# Patient Record
Sex: Female | Born: 1960 | Race: White | Hispanic: No | Marital: Married | Smoking: Never smoker
Health system: Southern US, Community
[De-identification: ages and names within clinical notes are randomized; demographics above are authoritative.]

## PROBLEM LIST (undated history)

## (undated) DIAGNOSIS — Z9189 Other specified personal risk factors, not elsewhere classified: Secondary | ICD-10-CM

## (undated) DIAGNOSIS — Z803 Family history of malignant neoplasm of breast: Secondary | ICD-10-CM

## (undated) DIAGNOSIS — E7800 Pure hypercholesterolemia, unspecified: Secondary | ICD-10-CM

## (undated) DIAGNOSIS — B019 Varicella without complication: Secondary | ICD-10-CM

## (undated) DIAGNOSIS — Z1371 Encounter for nonprocreative screening for genetic disease carrier status: Secondary | ICD-10-CM

## (undated) HISTORY — PX: COLONOSCOPY: SHX174

## (undated) HISTORY — DX: Encounter for nonprocreative screening for genetic disease carrier status: Z13.71

## (undated) HISTORY — DX: Other specified personal risk factors, not elsewhere classified: Z91.89

## (undated) HISTORY — DX: Pure hypercholesterolemia, unspecified: E78.00

## (undated) HISTORY — DX: Varicella without complication: B01.9

## (undated) HISTORY — DX: Family history of malignant neoplasm of breast: Z80.3

---

## 2004-11-23 ENCOUNTER — Ambulatory Visit: Payer: Self-pay | Admitting: Internal Medicine

## 2004-12-06 ENCOUNTER — Ambulatory Visit: Payer: Self-pay | Admitting: Internal Medicine

## 2005-07-02 ENCOUNTER — Ambulatory Visit: Payer: Self-pay | Admitting: Internal Medicine

## 2005-11-22 ENCOUNTER — Ambulatory Visit: Payer: Self-pay | Admitting: Internal Medicine

## 2006-11-28 ENCOUNTER — Ambulatory Visit: Payer: Self-pay | Admitting: Unknown Physician Specialty

## 2007-12-02 ENCOUNTER — Ambulatory Visit: Payer: Self-pay | Admitting: Unknown Physician Specialty

## 2008-12-07 ENCOUNTER — Ambulatory Visit: Payer: Self-pay | Admitting: Unknown Physician Specialty

## 2009-12-21 ENCOUNTER — Ambulatory Visit: Payer: Self-pay | Admitting: Unknown Physician Specialty

## 2011-01-10 ENCOUNTER — Ambulatory Visit: Payer: Self-pay | Admitting: Unknown Physician Specialty

## 2012-01-15 ENCOUNTER — Ambulatory Visit: Payer: Self-pay | Admitting: Family Medicine

## 2012-01-21 ENCOUNTER — Ambulatory Visit: Payer: Self-pay | Admitting: Family Medicine

## 2013-01-20 ENCOUNTER — Ambulatory Visit: Payer: Self-pay | Admitting: Family Medicine

## 2013-03-15 HISTORY — PX: COLONOSCOPY: SHX174

## 2014-01-25 ENCOUNTER — Ambulatory Visit: Payer: Self-pay | Admitting: Nurse Practitioner

## 2014-12-16 DIAGNOSIS — Z1371 Encounter for nonprocreative screening for genetic disease carrier status: Secondary | ICD-10-CM

## 2014-12-16 DIAGNOSIS — Z9189 Other specified personal risk factors, not elsewhere classified: Secondary | ICD-10-CM

## 2014-12-16 HISTORY — DX: Encounter for nonprocreative screening for genetic disease carrier status: Z13.71

## 2014-12-16 HISTORY — DX: Other specified personal risk factors, not elsewhere classified: Z91.89

## 2015-01-20 ENCOUNTER — Other Ambulatory Visit: Payer: Self-pay | Admitting: Obstetrics and Gynecology

## 2015-01-20 DIAGNOSIS — Z1231 Encounter for screening mammogram for malignant neoplasm of breast: Secondary | ICD-10-CM

## 2015-02-01 ENCOUNTER — Ambulatory Visit
Admission: RE | Admit: 2015-02-01 | Discharge: 2015-02-01 | Disposition: A | Discharge status: Home / Self Care | Payer: BLUE CROSS/BLUE SHIELD | Source: Ambulatory Visit | Attending: Obstetrics and Gynecology | Admitting: Obstetrics and Gynecology

## 2015-02-01 DIAGNOSIS — Z1231 Encounter for screening mammogram for malignant neoplasm of breast: Secondary | ICD-10-CM | POA: Diagnosis present

## 2015-02-01 LAB — HM MAMMOGRAPHY

## 2015-09-21 ENCOUNTER — Encounter: Payer: Self-pay | Admitting: Family Medicine

## 2015-09-21 ENCOUNTER — Ambulatory Visit (INDEPENDENT_AMBULATORY_CARE_PROVIDER_SITE_OTHER): Payer: BLUE CROSS/BLUE SHIELD | Admitting: Family Medicine

## 2015-09-21 ENCOUNTER — Encounter (INDEPENDENT_AMBULATORY_CARE_PROVIDER_SITE_OTHER): Payer: Self-pay

## 2015-09-21 VITALS — BP 130/75 | HR 99 | Temp 97.8°F | Ht 66.5 in | Wt 113.1 lb

## 2015-09-21 DIAGNOSIS — M549 Dorsalgia, unspecified: Secondary | ICD-10-CM | POA: Insufficient documentation

## 2015-09-21 DIAGNOSIS — F419 Anxiety disorder, unspecified: Secondary | ICD-10-CM | POA: Diagnosis not present

## 2015-09-21 DIAGNOSIS — M546 Pain in thoracic spine: Secondary | ICD-10-CM

## 2015-09-21 DIAGNOSIS — G500 Trigeminal neuralgia: Secondary | ICD-10-CM | POA: Insufficient documentation

## 2015-09-21 MED ORDER — CYCLOBENZAPRINE HCL 10 MG PO TABS: 10.0000 mg | ORAL_TABLET | Freq: Three times a day (TID) | ORAL | 0 refills | Status: DC | PRN

## 2015-09-21 MED ORDER — CITALOPRAM HYDROBROMIDE 20 MG PO TABS: 20.0000 mg | ORAL_TABLET | Freq: Every day | ORAL | 1 refills | Status: DC

## 2015-09-21 MED ORDER — ALPRAZOLAM 0.5 MG PO TABS: 0.5000 mg | ORAL_TABLET | Freq: Three times a day (TID) | ORAL | 0 refills | Status: DC | PRN

## 2015-09-21 NOTE — Assessment & Plan Note (Signed)
New problem. Appears to be MSK in origin. Advised use of NSAIDs as needed and Rx for Flexeril given to be used as needed. No indication for imaging.  Does not appear to be causing/contributing to patient's complaint of R ankle/foot numbness.

## 2015-09-21 NOTE — Progress Notes (Signed)
Pre visit review using our clinic review tool, if applicable. No additional management support is needed unless otherwise documented below in the visit note. 

## 2015-09-21 NOTE — Progress Notes (Signed)
Subjective:  Patient ID: Dana Booth, female    DOB: [birth year]  Age: 55 y.o. MRN: [id number]  CC: Anxiety, back pain  HPI Dana Booth is a 55 y.o. female presents to the clinic today as a new patient with the above complaints.  Anxiety  Patient endorses issues with anxiety earlier in life.  Over the past 1.5 years she's had increasing anxiety.  She states she has times where she feels like she is having a panic attack.  Anxiety has been worse after developing symptoms of trigeminal neuralgia and being concerned about MS. She is currently being followed by neurologist - Dr. Sherryll BurgerShah. They have discussed MRI but this is also a source of anxiety for her as she is claustrophobic.   Patient is incredibly anxious about undergoing an MRI and is quite concerned that she may have MS.  Patient is currently taking BuSpar. This was recently started.  Patient would like to discuss treatment options regarding her anxiety and she feels like this is severe.  Back pain  Patient reports a 3 to 4 month history of left-sided thoracic back pain.  Patient describes the pain as sharp.   Worse with certain movements.  Has been worse after walking and training her dog.  She reports that she has numbness in her right foot and lateral ankle. She is concerned that this is coming from her back as well.  She states that the numbness is worse when she's standing and improves when she bends forward.  Patient feels like her back pain is been exacerbated by certain activities and walking and training her dog.  No known relieving factors.  No other associated symptoms.  PMH, Surgical Hx, Family Hx, Social History reviewed and updated as below.  Past Medical History:  Diagnosis Date  . Chicken pox    Past Surgical History:  Procedure Laterality Date  . NO PAST SURGERIES     Family History  Problem Relation Age of Onset  . Breast cancer Mother 2460  . Arthritis Mother   . Breast cancer  Maternal Aunt 60  . Breast cancer Paternal Grandmother 7670  . Arthritis Father   . Diabetes Father   . Hyperlipidemia Father   . Hypertension Father    Social History  Substance Use Topics  . Smoking status: Never Smoker  . Smokeless tobacco: Never Used  . Alcohol use No   Review of Systems  Musculoskeletal: Positive for back pain.       R foot, ankle numbness/tingling.  Psychiatric/Behavioral: The patient is nervous/anxious.        Anxiety, stress.   All other systems reviewed and are negative.  Objective:   Today's Vitals: BP 130/75 (BP Location: Right Arm, Patient Position: Sitting, Cuff Size: Normal)   Pulse 99   Temp 97.8 F (36.6 C) (Oral)   Ht 5' 6.5" (1.689 m)   Wt 113 lb 2 oz (51.3 kg)   SpO2 97%   BMI 17.99 kg/m   Physical Exam  Constitutional: She is oriented to person, place, and time. She appears well-developed. No distress.  HENT:  Head: Normocephalic and atraumatic.  Mouth/Throat: Oropharynx is clear and moist.  Eyes: Conjunctivae are normal. No scleral icterus.  Neck: Neck supple.  Cardiovascular: Regular rhythm.  Tachycardia present.   Pulmonary/Chest: Effort normal. She has no wheezes. She has no rales.  Abdominal: Soft. She exhibits no distension. There is no tenderness. There is no rebound and no guarding.  Musculoskeletal:  Back - normal range  of motion. Left lower thoracic spine nontender to palpation.  Lymphadenopathy:    She has no cervical adenopathy.  Neurological: She is alert and oriented to person, place, and time.  No apparent focal neurological deficits.  Skin: Skin is warm and dry. No rash noted.  Psychiatric:  Anxious; tearful at times.  Vitals reviewed.  Assessment & Plan:   Problem List Items Addressed This Visit    Anxiety - Primary    New problem. This is the patient's primary issue. Anxiety seems to be debilitating and likely contributing to patient's other symptoms. I do not feel that the patient has underlying  neurological issues to suggest MS.  Sending to psychology for counseling/therapy. Starting on Celexa and PRN Xanax. Stop Buspar.       Relevant Medications   citalopram (CELEXA) 20 MG tablet   ALPRAZolam (XANAX) 0.5 MG tablet   Other Relevant Orders   Ambulatory referral to Psychology   Back pain    New problem. Appears to be MSK in origin. Advised use of NSAIDs as needed and Rx for Flexeril given to be used as needed. No indication for imaging.  Does not appear to be causing/contributing to patient's complaint of R ankle/foot numbness.      Relevant Medications   cyclobenzaprine (FLEXERIL) 10 MG tablet    Other Visit Diagnoses   None.     Outpatient Encounter Prescriptions as of 09/21/2015  Medication Sig  . Cholecalciferol (VITAMIN D3) 2000 units capsule Take by mouth.  . gabapentin (NEURONTIN) 300 MG capsule Take by mouth 3 (three) times daily.  . norethindrone (CAMILA) 0.35 MG tablet Take by mouth.  . [DISCONTINUED] busPIRone (BUSPAR) 10 MG tablet Take by mouth.  . ALPRAZolam (XANAX) 0.5 MG tablet Take 1 tablet (0.5 mg total) by mouth 3 (three) times daily as needed for anxiety.  . citalopram (CELEXA) 20 MG tablet Take 1 tablet (20 mg total) by mouth daily.  . cyclobenzaprine (FLEXERIL) 10 MG tablet Take 1 tablet (10 mg total) by mouth 3 (three) times daily as needed for muscle spasms.   No facility-administered encounter medications on file as of 09/21/2015.     Follow-up: Return in about 6 weeks (around 11/02/2015).  Everlene Other DO [facility]

## 2015-09-21 NOTE — Patient Instructions (Signed)
Take the medication as prescribed.  Use ibuprofen or aleve as needed for your back. Use the muscle relaxer if needed.  Follow up in 6 weeks.  Take care  Dr. Adriana Simasook

## 2015-09-21 NOTE — Assessment & Plan Note (Signed)
New problem. This is the patient's primary issue. Anxiety seems to be debilitating and likely contributing to patient's other symptoms. I do not feel that the patient has underlying neurological issues to suggest MS.  Sending to psychology for counseling/therapy. Starting on Celexa and PRN Xanax. Stop Buspar.

## 2015-09-23 ENCOUNTER — Telehealth: Payer: Self-pay | Admitting: Family Medicine

## 2015-09-23 NOTE — Telephone Encounter (Signed)
Pt saw Dr. Adriana Simasook this past Wednesday. She called today, she is dizzie, weak, calmy and a little faint whe she first wakes up. She is not sure if it is the citalopram (CELEXA) 20 MG tablet. Please advise.  Thanks

## 2015-09-23 NOTE — Telephone Encounter (Signed)
Urgent care ED? Please advise

## 2015-09-23 NOTE — Telephone Encounter (Signed)
Likely related to underlying anxiety. Doubt it is from the medication as it was just started. Continue to monitor.

## 2015-09-23 NOTE — Telephone Encounter (Signed)
Patient has been notified

## 2015-09-29 ENCOUNTER — Ambulatory Visit (INDEPENDENT_AMBULATORY_CARE_PROVIDER_SITE_OTHER): Payer: BLUE CROSS/BLUE SHIELD | Admitting: Family Medicine

## 2015-09-29 ENCOUNTER — Encounter: Payer: Self-pay | Admitting: Family Medicine

## 2015-09-29 DIAGNOSIS — F419 Anxiety disorder, unspecified: Secondary | ICD-10-CM | POA: Diagnosis not present

## 2015-09-29 MED ORDER — DIAZEPAM 2 MG PO TABS: 2.0000 mg | ORAL_TABLET | Freq: Two times a day (BID) | ORAL | 0 refills | Status: DC | PRN

## 2015-09-29 NOTE — Progress Notes (Signed)
Pre visit review using our clinic review tool, if applicable. No additional management support is needed unless otherwise documented below in the visit note. 

## 2015-09-29 NOTE — Progress Notes (Signed)
   Subjective:  Patient ID: Dana Booth, female    DOB: 26-Oct-1960  Age: 55 y.o. MRN: [id number]  CC: Medication issues/Anxiety  HPI:  55 year old female with severe anxiety presents with the above complaints.  Anxiety/Medication issues  Patient seen on 9/6.  Started on PRN Xanax and Celexa for anxiety.  Patient presents today with complaints of medication side effects/concerns.  Patient has been experiencing nausea, lightheadedness, fatigue. She attributes these to the Celexa. She states she is quite sedated from Xanax when she takes it. She states that she has to lie down because of the sedation.  As a result of her concern for medication side effects, she has decreased her dose of Celexa down to 10 mg daily. She is using the Xanax sparingly.   Patient states that she does feel slightly improved hours after use of Xanax. However, she still has severe anxiety.  She has not been contacted about her referral to psychology yet.  Patient would like to discuss treatment options/medication changes.  Social Hx   Social History   Social History  . Marital status: Married    Spouse name: N/A  . Number of children: N/A  . Years of education: N/A   Social History Main Topics  . Smoking status: Never Smoker  . Smokeless tobacco: Never Used  . Alcohol use No  . Drug use: No  . Sexual activity: Yes   Other Topics Concern  . None   Social History Narrative  . None   Review of Systems  Constitutional: Positive for fatigue.  Gastrointestinal: Positive for nausea.  Neurological: Positive for light-headedness.  Psychiatric/Behavioral:       Anxiety.    Objective:  BP 136/75 (BP Location: Right Arm, Patient Position: Sitting, Cuff Size: Normal)   Pulse 88   Temp 98.3 F (36.8 C) (Oral)   Wt 112 lb 8 oz (51 kg)   SpO2 99%   BMI 17.89 kg/m   BP/Weight 09/29/2015 09/21/2015  Systolic BP 136 130  Diastolic BP 75 75  Wt. (Lbs) 112.5 113.13  BMI 17.89 17.99     Physical Exam  Constitutional: She is oriented to person, place, and time. She appears well-developed. No distress.  HENT:  Head: Normocephalic and atraumatic.  Pulmonary/Chest: Effort normal.  Neurological: She is alert and oriented to person, place, and time.  Psychiatric:  Incredibly anxious and perseverative about symptoms.  Vitals reviewed.    Assessment & Plan:   Problem List Items Addressed This Visit    Anxiety    Established problem, worsening/uncontrolled. Advised decrease in Celexa to 10 mg daily. Stopping Xanax. Will try low dose Valium. Needs to see psychology ASAP.      Relevant Medications   diazepam (VALIUM) 2 MG tablet    Other Visit Diagnoses   None.     Meds ordered this encounter  Medications  . diazepam (VALIUM) 2 MG tablet    Sig: Take 1 tablet (2 mg total) by mouth every 12 (twelve) hours as needed for anxiety.    Dispense:  30 tablet    Refill:  0    Follow-up: Has follow up scheduled.   Everlene OtherJayce Gaetana Kawahara DO [facility] [place]

## 2015-09-29 NOTE — Patient Instructions (Signed)
Continue the decreased Celexa (10 mg daily).  Use the Valium 2 mg twice daily as needed (start with it in the am).  I will expedite the therapist appt.  Follow up in 4-6 weeks.  Take care  Dr. Adriana Simasook

## 2015-09-29 NOTE — Assessment & Plan Note (Signed)
Established problem, worsening/uncontrolled. Advised decrease in Celexa to 10 mg daily. Stopping Xanax. Will try low dose Valium. Needs to see psychology ASAP.

## 2015-10-06 ENCOUNTER — Ambulatory Visit (INDEPENDENT_AMBULATORY_CARE_PROVIDER_SITE_OTHER): Payer: BLUE CROSS/BLUE SHIELD | Admitting: Psychology

## 2015-10-06 DIAGNOSIS — F411 Generalized anxiety disorder: Secondary | ICD-10-CM | POA: Diagnosis not present

## 2015-10-13 ENCOUNTER — Ambulatory Visit (INDEPENDENT_AMBULATORY_CARE_PROVIDER_SITE_OTHER): Payer: BLUE CROSS/BLUE SHIELD | Admitting: Psychology

## 2015-10-13 DIAGNOSIS — F411 Generalized anxiety disorder: Secondary | ICD-10-CM | POA: Diagnosis not present

## 2015-10-20 ENCOUNTER — Ambulatory Visit (INDEPENDENT_AMBULATORY_CARE_PROVIDER_SITE_OTHER): Payer: BLUE CROSS/BLUE SHIELD | Admitting: Psychology

## 2015-10-20 DIAGNOSIS — F411 Generalized anxiety disorder: Secondary | ICD-10-CM

## 2015-10-24 ENCOUNTER — Telehealth: Payer: Self-pay | Admitting: Family Medicine

## 2015-10-24 NOTE — Telephone Encounter (Signed)
[facility] sent message stating that pt declined appt to them at this time. Referral has been closed.

## 2015-10-27 ENCOUNTER — Ambulatory Visit (INDEPENDENT_AMBULATORY_CARE_PROVIDER_SITE_OTHER): Payer: BLUE CROSS/BLUE SHIELD | Admitting: Psychology

## 2015-10-27 DIAGNOSIS — F411 Generalized anxiety disorder: Secondary | ICD-10-CM

## 2015-11-07 ENCOUNTER — Ambulatory Visit (INDEPENDENT_AMBULATORY_CARE_PROVIDER_SITE_OTHER): Payer: BLUE CROSS/BLUE SHIELD | Admitting: Family Medicine

## 2015-11-07 ENCOUNTER — Ambulatory Visit (INDEPENDENT_AMBULATORY_CARE_PROVIDER_SITE_OTHER): Payer: BLUE CROSS/BLUE SHIELD

## 2015-11-07 ENCOUNTER — Encounter: Payer: Self-pay | Admitting: Family Medicine

## 2015-11-07 ENCOUNTER — Ambulatory Visit: Payer: BLUE CROSS/BLUE SHIELD | Admitting: Family Medicine

## 2015-11-07 VITALS — BP 136/82 | HR 92 | Temp 98.0°F | Wt 114.1 lb

## 2015-11-07 DIAGNOSIS — F419 Anxiety disorder, unspecified: Secondary | ICD-10-CM

## 2015-11-07 DIAGNOSIS — M545 Low back pain: Secondary | ICD-10-CM

## 2015-11-07 DIAGNOSIS — G8929 Other chronic pain: Secondary | ICD-10-CM

## 2015-11-07 DIAGNOSIS — Z23 Encounter for immunization: Secondary | ICD-10-CM

## 2015-11-07 NOTE — Progress Notes (Signed)
Pre visit review using our clinic review tool, if applicable. No additional management support is needed unless otherwise documented below in the visit note. 

## 2015-11-07 NOTE — Patient Instructions (Signed)
I am glad you're doing well.  We will call with your xray results.  Follow up to be arranged after the results return.  Take care  Dr. Adriana Simasook

## 2015-11-07 NOTE — Progress Notes (Signed)
   Subjective:  Patient ID: Dana PennaAmy L Berenguer, female    DOB: October 16, 1960  Age: 55 y.o. MRN: [id number]  CC: Follow up Anxiety  HPI:  55 year old female with trigeminal neuralgia, anxiety presents for follow-up. She also has complaints of right foot numbness as well as right buttock pain.  Anxiety  Drastically improved with celexa and seeing psychologist.  Not taking Benzo at this time.  She states that she is doing much better.  Would like to continue celexa.  R buttock pain, R foot numbness  Patient reports that over the last few months she's had numbness in her right foot. She is told me about this previously.  Her neurologist felt like this was from Morton's neuroma.  However, she now complains of right buttock pain. This is new.  She states it is worse with standing and sitting.  She reports relief with flexion.  She is unsure if it's correlated/connected with the right foot numbness.  She does not endorse any radiating pain that comes from the back downward.  No reports of numbness or tingling or pain in the region from the low back/buttock to the foot.  She is currently on gabapentin. She has continued to have symptoms despite this.  Social Hx   Social History   Social History  . Marital status: Married    Spouse name: N/A  . Number of children: N/A  . Years of education: N/A   Social History Main Topics  . Smoking status: Never Smoker  . Smokeless tobacco: Never Used  . Alcohol use No  . Drug use: No  . Sexual activity: Yes   Other Topics Concern  . None   Social History Narrative  . None   Review of Systems  Musculoskeletal: Positive for back pain.  Neurological: Positive for numbness.  Psychiatric/Behavioral: The patient is nervous/anxious.    Objective:  BP 136/82 (BP Location: Right Arm, Patient Position: Sitting, Cuff Size: Normal)   Pulse 92   Temp 98 F (36.7 C) (Oral)   Wt 114 lb 2 oz (51.8 kg)   SpO2 95%   BMI 18.14 kg/m    BP/Weight 11/07/2015 09/29/2015 09/21/2015  Systolic BP 136 136 130  Diastolic BP 82 75 75  Wt. (Lbs) 114.13 112.5 113.13  BMI 18.14 17.89 17.99   Physical Exam  Constitutional: She is oriented to person, place, and time. She appears well-developed. No distress.  Pulmonary/Chest: Effort normal.  Musculoskeletal:  Mild tenderness to palpation at the area of the piriformis muscle. Negative straight leg raise.   Neurological: She is alert and oriented to person, place, and time.  Psychiatric: She has a normal mood and affect.  Vitals reviewed.  Assessment & Plan:   Problem List Items Addressed This Visit    Chronic right-sided low back pain    New problem. Numbness in foot may or may not be related. Obtaining Xray to assess.      Relevant Orders   DG Lumbar Spine Complete (Completed)   Anxiety - Primary    Improved. Continue Celexa and psychology visits.       Other Visit Diagnoses    Encounter for immunization       Relevant Orders   Flu Vaccine QUAD 36+ mos IM (Completed)     Follow-up: Pending xray results.   Everlene OtherJayce Yulia Ulrich DO [facility] [place]

## 2015-11-08 DIAGNOSIS — M545 Low back pain: Secondary | ICD-10-CM

## 2015-11-08 DIAGNOSIS — G8929 Other chronic pain: Secondary | ICD-10-CM | POA: Insufficient documentation

## 2015-11-08 NOTE — Assessment & Plan Note (Signed)
New problem. Numbness in foot may or may not be related. Obtaining Xray to assess.

## 2015-11-08 NOTE — Assessment & Plan Note (Signed)
Improved. Continue Celexa and psychology visits.

## 2015-11-10 ENCOUNTER — Ambulatory Visit (INDEPENDENT_AMBULATORY_CARE_PROVIDER_SITE_OTHER): Payer: BLUE CROSS/BLUE SHIELD | Admitting: Psychology

## 2015-11-10 DIAGNOSIS — F411 Generalized anxiety disorder: Secondary | ICD-10-CM | POA: Diagnosis not present

## 2015-11-17 ENCOUNTER — Ambulatory Visit (INDEPENDENT_AMBULATORY_CARE_PROVIDER_SITE_OTHER): Payer: BLUE CROSS/BLUE SHIELD | Admitting: Psychology

## 2015-11-17 DIAGNOSIS — F411 Generalized anxiety disorder: Secondary | ICD-10-CM

## 2015-11-24 ENCOUNTER — Ambulatory Visit (INDEPENDENT_AMBULATORY_CARE_PROVIDER_SITE_OTHER): Payer: BLUE CROSS/BLUE SHIELD | Admitting: Psychology

## 2015-11-24 DIAGNOSIS — F411 Generalized anxiety disorder: Secondary | ICD-10-CM | POA: Diagnosis not present

## 2015-12-01 ENCOUNTER — Ambulatory Visit (INDEPENDENT_AMBULATORY_CARE_PROVIDER_SITE_OTHER): Payer: BLUE CROSS/BLUE SHIELD | Admitting: Psychology

## 2015-12-01 DIAGNOSIS — F411 Generalized anxiety disorder: Secondary | ICD-10-CM

## 2015-12-15 ENCOUNTER — Ambulatory Visit (INDEPENDENT_AMBULATORY_CARE_PROVIDER_SITE_OTHER): Payer: BLUE CROSS/BLUE SHIELD | Admitting: Psychology

## 2015-12-15 DIAGNOSIS — F411 Generalized anxiety disorder: Secondary | ICD-10-CM

## 2015-12-23 ENCOUNTER — Other Ambulatory Visit: Payer: Self-pay | Admitting: Obstetrics and Gynecology

## 2015-12-23 DIAGNOSIS — Z1231 Encounter for screening mammogram for malignant neoplasm of breast: Secondary | ICD-10-CM

## 2015-12-29 ENCOUNTER — Ambulatory Visit (INDEPENDENT_AMBULATORY_CARE_PROVIDER_SITE_OTHER): Payer: BLUE CROSS/BLUE SHIELD | Admitting: Psychology

## 2015-12-29 DIAGNOSIS — F411 Generalized anxiety disorder: Secondary | ICD-10-CM | POA: Diagnosis not present

## 2016-01-17 LAB — HM PAP SMEAR: HM PAP: NEGATIVE

## 2016-02-02 ENCOUNTER — Ambulatory Visit: Payer: BLUE CROSS/BLUE SHIELD

## 2016-02-13 ENCOUNTER — Ambulatory Visit
Admission: RE | Admit: 2016-02-13 | Discharge: 2016-02-13 | Disposition: A | Discharge status: Home / Self Care | Payer: BLUE CROSS/BLUE SHIELD | Source: Ambulatory Visit | Attending: Obstetrics and Gynecology | Admitting: Obstetrics and Gynecology

## 2016-02-13 DIAGNOSIS — Z1231 Encounter for screening mammogram for malignant neoplasm of breast: Secondary | ICD-10-CM | POA: Diagnosis present

## 2016-03-13 ENCOUNTER — Encounter: Payer: Self-pay | Admitting: Obstetrics and Gynecology

## 2016-04-05 ENCOUNTER — Other Ambulatory Visit: Payer: Self-pay | Admitting: Family Medicine

## 2016-09-20 ENCOUNTER — Ambulatory Visit (INDEPENDENT_AMBULATORY_CARE_PROVIDER_SITE_OTHER): Payer: BLUE CROSS/BLUE SHIELD | Admitting: Family Medicine

## 2016-09-20 ENCOUNTER — Encounter: Payer: Self-pay | Admitting: Family Medicine

## 2016-09-20 VITALS — BP 116/74 | HR 94 | Temp 98.6°F | Resp 16 | Wt 125.0 lb

## 2016-09-20 DIAGNOSIS — Z13220 Encounter for screening for lipoid disorders: Secondary | ICD-10-CM

## 2016-09-20 DIAGNOSIS — F419 Anxiety disorder, unspecified: Secondary | ICD-10-CM

## 2016-09-20 DIAGNOSIS — R739 Hyperglycemia, unspecified: Secondary | ICD-10-CM

## 2016-09-20 DIAGNOSIS — G500 Trigeminal neuralgia: Secondary | ICD-10-CM | POA: Diagnosis not present

## 2016-09-20 DIAGNOSIS — Z130 Encounter for screening for diseases of the blood and blood-forming organs and certain disorders involving the immune mechanism: Secondary | ICD-10-CM

## 2016-09-20 MED ORDER — CITALOPRAM HYDROBROMIDE 20 MG PO TABS: 20.0000 mg | ORAL_TABLET | Freq: Every day | ORAL | 3 refills | Status: DC

## 2016-09-20 NOTE — Patient Instructions (Signed)
If you want to taper: Celexa 10 mg for 1-2 weeks, then stop. If you have symptoms, you can go do 10 mg every other day after.  Follow up annually.  Take care  Dr. Adriana Simasook

## 2016-09-21 LAB — CBC
HCT: 40 % (ref 36.0–46.0)
HEMOGLOBIN: 13.3 g/dL (ref 12.0–15.0)
MCHC: 33.3 g/dL (ref 30.0–36.0)
MCV: 92.6 fl (ref 78.0–100.0)
Platelets: 225 10*3/uL (ref 150.0–400.0)
RBC: 4.32 Mil/uL (ref 3.87–5.11)
RDW: 12.6 % (ref 11.5–15.5)
WBC: 7.7 10*3/uL (ref 4.0–10.5)

## 2016-09-21 LAB — LIPID PANEL
CHOL/HDL RATIO: 3
Cholesterol: 189 mg/dL (ref 0–200)
HDL: 60.3 mg/dL (ref >39.00)
LDL Cholesterol: 110 mg/dL — ABNORMAL HIGH (ref 0–99)
NONHDL: 128.79
Triglycerides: 92 mg/dL (ref 0.0–149.0)
VLDL: 18.4 mg/dL (ref 0.0–40.0)

## 2016-09-21 LAB — COMPREHENSIVE METABOLIC PANEL
ALK PHOS: 48 U/L (ref 39–117)
ALT: 16 U/L (ref 0–35)
AST: 19 U/L (ref 0–37)
Albumin: 4.7 g/dL (ref 3.5–5.2)
BILIRUBIN TOTAL: 0.8 mg/dL (ref 0.2–1.2)
BUN: 19 mg/dL (ref 6–23)
CO2: 32 meq/L (ref 19–32)
CREATININE: 0.87 mg/dL (ref 0.40–1.20)
Calcium: 9.8 mg/dL (ref 8.4–10.5)
Chloride: 100 mEq/L (ref 96–112)
GFR: 71.19 mL/min (ref >60.00)
GLUCOSE: 94 mg/dL (ref 70–99)
Potassium: 4.2 mEq/L (ref 3.5–5.1)
Sodium: 139 mEq/L (ref 135–145)
TOTAL PROTEIN: 7.5 g/dL (ref 6.0–8.3)

## 2016-09-21 NOTE — Assessment & Plan Note (Signed)
Doing well. Discuss continuing vs tapering Celexa. Patient desires to continued. Rx sent.

## 2016-09-21 NOTE — Progress Notes (Signed)
   Subjective:  Patient ID: Dana Booth, female    DOB: 1960/09/08  Age: 56 y.o. MRN: [id number]  CC: Follow up  HPI:  56 year old female with a history of trigeminal neuralgia, and severe anxiety presents for follow-up.  Trigeminal neuralgia  Doing well. She is now off her medication and states that she is essentially asymptomatic.  Anxiety  Has had a tremendous improvement with Celexa and seeing a counselor.  She states that she is doing exceptionally well at this time.  She needs a refill on her Celexa.  Social Hx   Social History   Social History  . Marital status: Married    Spouse name: N/A  . Number of children: N/A  . Years of education: N/A   Social History Main Topics  . Smoking status: Never Smoker  . Smokeless tobacco: Never Used  . Alcohol use No  . Drug use: No  . Sexual activity: Yes   Other Topics Concern  . None   Social History Narrative  . None    Review of Systems  Constitutional: Negative.   Psychiatric/Behavioral: The patient is nervous/anxious.    Objective:  BP 116/74 (BP Location: Left Arm, Patient Position: Sitting, Cuff Size: Normal)   Pulse 94   Temp 98.6 F (37 C) (Oral)   Resp 16   Wt 125 lb (56.7 kg)   SpO2 98%   BMI 19.87 kg/m   BP/Weight 09/20/2016 11/07/2015 09/29/2015  Systolic BP 116 136 136  Diastolic BP 74 82 75  Wt. (Lbs) 125 114.13 112.5  BMI 19.87 18.14 17.89    Physical Exam  Constitutional: She is oriented to person, place, and time. She appears well-developed. No distress.  Cardiovascular: Normal rate and regular rhythm.   No murmur heard. Pulmonary/Chest: Effort normal. She has no wheezes. She has no rales.  Abdominal: Soft. She exhibits no distension. There is no tenderness. There is no rebound and no guarding.  Neurological: She is alert and oriented to person, place, and time.  Psychiatric: She has a normal mood and affect.  Vitals reviewed.   Assessment & Plan:   Problem List Items  Addressed This Visit    Trigeminal neuralgia - Primary    Doing well. No concerns. Off medication.      Relevant Medications   citalopram (CELEXA) 20 MG tablet   Anxiety    Doing well. Discuss continuing vs tapering Celexa. Patient desires to continued. Rx sent.       Relevant Medications   citalopram (CELEXA) 20 MG tablet    Other Visit Diagnoses    Screening for deficiency anemia       Relevant Orders   CBC   Lipid screening       Relevant Orders   Lipid panel   Blood glucose elevated       Relevant Orders   Comprehensive metabolic panel      Meds ordered this encounter  Medications  . citalopram (CELEXA) 20 MG tablet    Sig: Take 1 tablet (20 mg total) by mouth daily.    Dispense:  90 tablet    Refill:  3   Follow-up: Return in about 1 year (around 09/20/2017).  Everlene OtherJayce Teegan Brandis DO [facility]

## 2016-09-21 NOTE — Assessment & Plan Note (Signed)
Doing well. No concerns. Off medication.

## 2017-01-17 ENCOUNTER — Ambulatory Visit (INDEPENDENT_AMBULATORY_CARE_PROVIDER_SITE_OTHER): Payer: No Typology Code available for payment source | Admitting: Obstetrics and Gynecology

## 2017-01-17 ENCOUNTER — Encounter: Payer: Self-pay | Admitting: Obstetrics and Gynecology

## 2017-01-17 VITALS — BP 122/80 | Ht 67.0 in | Wt 128.0 lb

## 2017-01-17 DIAGNOSIS — Z01419 Encounter for gynecological examination (general) (routine) without abnormal findings: Secondary | ICD-10-CM | POA: Diagnosis not present

## 2017-01-17 DIAGNOSIS — Z1231 Encounter for screening mammogram for malignant neoplasm of breast: Secondary | ICD-10-CM | POA: Diagnosis not present

## 2017-01-17 DIAGNOSIS — Z9189 Other specified personal risk factors, not elsewhere classified: Secondary | ICD-10-CM

## 2017-01-17 DIAGNOSIS — N951 Menopausal and female climacteric states: Secondary | ICD-10-CM | POA: Diagnosis not present

## 2017-01-17 DIAGNOSIS — Z1239 Encounter for other screening for malignant neoplasm of breast: Secondary | ICD-10-CM

## 2017-01-17 NOTE — Progress Notes (Signed)
PCP: Coral Spikes, DO   Chief Complaint  Patient presents with  . Gynecologic Exam    HPI:      [name] is a 57 y.o. [id number] who LMP was No LMP recorded. Patient is postmenopausal., presents today for her annual examination.  Her menses are infrequent since perimenopausal. Had 1 day spotting 8/18 and 10/18. No DUB sx. She does not have vasomotor sx.   Sex activity: single partner, contraception - none. She does not have vaginal dryness.  Last Pap: January 17, 2016  Results were: no abnormalities /neg HPV DNA.  Hx of STDs: none  Last mammogram: February 13, 2016  Results were: normal--routine follow-up in 12 months There is a FH of breast cancer mom, MGM, and mat aunt. Pt is MyRisk neg 2016, IBIS=21.5%. There is no FH of ovarian cancer. The patient does do self-breast exams. She is taking Vit D supp and had normal levels last yr. She has not had scr breast MRI.   Colonoscopy: colonoscopy 5 years ago without abnormalities. . Repeat due after 10 years.   Tobacco use: The patient denies current or previous tobacco use. Alcohol use: none Exercise: moderately active  She does get adequate calcium and Vitamin D in her diet.   Past Medical History:  Diagnosis Date  . BRCA negative 12/2014   MyRisk neg  . Chicken pox   . Family history of breast cancer   . Increased risk of breast cancer 12/2014   IBIS=21.5%    Past Surgical History:  Procedure Laterality Date  . COLONOSCOPY    . NO PAST SURGERIES      Family History  Problem Relation Age of Onset  . Breast cancer Mother 42       twice  . Arthritis Mother   . Breast cancer Maternal Aunt 70  . Arthritis Father   . Diabetes Father   . Hyperlipidemia Father   . Hypertension Father   . Breast cancer Maternal Grandmother 80    Social History   Socioeconomic History  . Marital status: Married    Spouse name: Not on file  . Number of children: Not on file  . Years of education: Not on file  . Highest  education level: Not on file  Social Needs  . Financial resource strain: Not on file  . Food insecurity - worry: Not on file  . Food insecurity - inability: Not on file  . Transportation needs - medical: Not on file  . Transportation needs - non-medical: Not on file  Occupational History  . Not on file  Tobacco Use  . Smoking status: Never Smoker  . Smokeless tobacco: Never Used  Substance and Sexual Activity  . Alcohol use: No  . Drug use: No  . Sexual activity: Yes  Other Topics Concern  . Not on file  Social History Narrative  . Not on file    No outpatient medications have been marked as taking for the 01/17/17 encounter (Office Visit) with Booth, Deirdre Evener, PA-C.      ROS:  Review of Systems  Constitutional: Negative for fatigue, fever and unexpected weight change.  Respiratory: Negative for cough, shortness of breath and wheezing.   Cardiovascular: Negative for chest pain, palpitations and leg swelling.  Gastrointestinal: Negative for blood in stool, constipation, diarrhea, nausea and vomiting.  Endocrine: Negative for cold intolerance, heat intolerance and polyuria.  Genitourinary: Negative for dyspareunia, dysuria, flank pain, frequency, genital sores, hematuria, menstrual problem, pelvic pain, urgency,  vaginal bleeding, vaginal discharge and vaginal pain.  Musculoskeletal: Negative for back pain, joint swelling and myalgias.  Skin: Negative for rash.  Neurological: Negative for dizziness, syncope, light-headedness, numbness and headaches.  Hematological: Negative for adenopathy.  Psychiatric/Behavioral: Negative for agitation, confusion, sleep disturbance and suicidal ideas. The patient is not nervous/anxious.      Objective: BP 122/80   Ht 5' 7" (1.702 m)   Wt 128 lb (58.1 kg)   BMI 20.05 kg/m    Physical Exam  Constitutional: She is oriented to person, place, and time. She appears well-developed and well-nourished.  Genitourinary: Vagina normal and  uterus normal. There is no rash or tenderness on the right labia. There is no rash or tenderness on the left labia. No erythema or tenderness in the vagina. No vaginal discharge found. Right adnexum does not display mass and does not display tenderness. Left adnexum does not display mass and does not display tenderness. Cervix does not exhibit motion tenderness or polyp. Uterus is not enlarged or tender.  Neck: Normal range of motion. No thyromegaly present.  Cardiovascular: Normal rate, regular rhythm and normal heart sounds.  No murmur heard. Pulmonary/Chest: Effort normal and breath sounds normal. Right breast exhibits no mass, no nipple discharge, no skin change and no tenderness. Left breast exhibits no mass, no nipple discharge, no skin change and no tenderness.  Abdominal: Soft. There is no tenderness. There is no guarding.  Musculoskeletal: Normal range of motion.  Neurological: She is alert and oriented to person, place, and time. No cranial nerve deficit.  Psychiatric: She has a normal mood and affect. Her behavior is normal.  Vitals reviewed.   Assessment/Plan:  Encounter for annual routine gynecological examination  Screening for breast cancer - Pt to sched mammo. - Plan: MM SCREENING BREAST TOMO BILATERAL  Increased risk of breast cancer - Cont monthly SBE, yearly CBE and mammos, and Vit D. Scr br MRI offered. Pt to f/u 5/19 if wants to sched.  - Plan: MM SCREENING BREAST TOMO BILATERAL  Perimenopause - F/u prn DUB.         GYN counsel breast self exam, mammography screening, menopause, adequate intake of calcium and vitamin D, diet and exercise    F/U  Return in about 1 year (around 01/17/2018).  Dana B. Copland, PA-C 01/17/2017 9:03 AM

## 2017-01-17 NOTE — Patient Instructions (Signed)
I value your feedback and entrusting us with your care. If you get a [facility] patient survey, I would appreciate you taking the time to let us know about your experience today. Thank you! 

## 2017-02-14 ENCOUNTER — Ambulatory Visit
Admission: RE | Admit: 2017-02-14 | Discharge: 2017-02-14 | Disposition: A | Discharge status: Home / Self Care | Payer: No Typology Code available for payment source | Source: Ambulatory Visit | Attending: Obstetrics and Gynecology | Admitting: Obstetrics and Gynecology

## 2017-02-14 ENCOUNTER — Other Ambulatory Visit: Payer: Self-pay | Admitting: Obstetrics and Gynecology

## 2017-02-14 DIAGNOSIS — R928 Other abnormal and inconclusive findings on diagnostic imaging of breast: Secondary | ICD-10-CM | POA: Insufficient documentation

## 2017-02-14 DIAGNOSIS — Z9189 Other specified personal risk factors, not elsewhere classified: Secondary | ICD-10-CM | POA: Diagnosis not present

## 2017-02-14 DIAGNOSIS — Z1231 Encounter for screening mammogram for malignant neoplasm of breast: Secondary | ICD-10-CM | POA: Diagnosis present

## 2017-02-14 DIAGNOSIS — N6320 Unspecified lump in the left breast, unspecified quadrant: Secondary | ICD-10-CM

## 2017-02-14 DIAGNOSIS — Z1239 Encounter for other screening for malignant neoplasm of breast: Secondary | ICD-10-CM

## 2017-02-20 ENCOUNTER — Ambulatory Visit
Admission: RE | Admit: 2017-02-20 | Discharge: 2017-02-20 | Disposition: A | Discharge status: Home / Self Care | Payer: No Typology Code available for payment source | Source: Ambulatory Visit | Attending: Obstetrics and Gynecology | Admitting: Obstetrics and Gynecology

## 2017-02-20 ENCOUNTER — Encounter: Payer: Self-pay | Admitting: Obstetrics and Gynecology

## 2017-02-20 DIAGNOSIS — R928 Other abnormal and inconclusive findings on diagnostic imaging of breast: Secondary | ICD-10-CM

## 2017-02-20 DIAGNOSIS — N6320 Unspecified lump in the left breast, unspecified quadrant: Secondary | ICD-10-CM

## 2017-03-26 ENCOUNTER — Telehealth: Payer: Self-pay

## 2017-03-26 NOTE — Telephone Encounter (Signed)
Can be normal as long as not lasting 12-14 days or bleeding every couple of wks. Follow expectantly since tapering off. RN to notify pt.

## 2017-03-26 NOTE — Telephone Encounter (Signed)
Pt of [facility] reports she has had a couple of intermittent periods this past year. She has had a fairly heavy period tapering off now on day 8. This is uncommon for her. She is inquiring if this is normal since she is going into menopause. [id number]#[phone]#[phone]

## 2017-03-27 NOTE — Telephone Encounter (Signed)
Pt aware.

## 2017-10-02 ENCOUNTER — Other Ambulatory Visit: Payer: Self-pay | Admitting: Family Medicine

## 2018-02-06 ENCOUNTER — Other Ambulatory Visit: Payer: Self-pay | Admitting: Obstetrics and Gynecology

## 2018-02-11 ENCOUNTER — Ambulatory Visit (INDEPENDENT_AMBULATORY_CARE_PROVIDER_SITE_OTHER): Payer: 59 | Admitting: Obstetrics and Gynecology

## 2018-02-11 ENCOUNTER — Encounter: Payer: Self-pay | Admitting: Obstetrics and Gynecology

## 2018-02-11 VITALS — BP 144/80 | HR 106 | Ht 67.0 in | Wt 131.0 lb

## 2018-02-11 DIAGNOSIS — Z1239 Encounter for other screening for malignant neoplasm of breast: Secondary | ICD-10-CM

## 2018-02-11 DIAGNOSIS — Z01419 Encounter for gynecological examination (general) (routine) without abnormal findings: Secondary | ICD-10-CM | POA: Diagnosis not present

## 2018-02-11 DIAGNOSIS — Z9189 Other specified personal risk factors, not elsewhere classified: Secondary | ICD-10-CM

## 2018-02-11 DIAGNOSIS — N951 Menopausal and female climacteric states: Secondary | ICD-10-CM

## 2018-02-11 NOTE — Progress Notes (Signed)
PCP: Coral Spikes, DO   Chief Complaint  Patient presents with  . Gynecologic Exam    HPI:      [name] is a 58 y.o. G2P1102 who LMP was No LMP recorded. Patient is postmenopausal., presents today for her annual examination.  Her menses are infrequent since perimenopausal. Last bleeding was 3/19. She does have occas vasomotor sx.   Sex activity: single partner, contraception - none. She does not have vaginal dryness.  Last Pap: January 17, 2016  Results were: no abnormalities /neg HPV DNA.  Hx of STDs: none  Last mammogram: 02/20/17  Results were: normal--routine follow-up in 12 months There is a FH of breast cancer mom, MGM, and mat aunt. Pt is MyRisk neg 2016, IBIS=21.5%. There is no FH of ovarian cancer. The patient does do self-breast exams. She is taking Vit D supp. She has not had scr breast MRI.   Colonoscopy: colonoscopy 6 years ago without abnormalities. . Repeat due after 10 years.   Tobacco use: The patient denies current or previous tobacco use. Alcohol use: none Exercise: moderately active  She does get adequate calcium and Vitamin D in her diet. Labs with PCP  Past Medical History:  Diagnosis Date  . BRCA negative 12/2014   MyRisk neg  . Chicken pox   . Family history of breast cancer   . Increased risk of breast cancer 12/2014   IBIS=21.5%    Past Surgical History:  Procedure Laterality Date  . COLONOSCOPY      Family History  Problem Relation Age of Onset  . Breast cancer Mother 64       twice  . Arthritis Mother   . Breast cancer Maternal Aunt 70  . Arthritis Father   . Diabetes Father        TYPE 2  . Hyperlipidemia Father   . Hypertension Father   . Breast cancer Maternal Grandmother 80    Social History   Socioeconomic History  . Marital status: Married    Spouse name: Not on file  . Number of children: Not on file  . Years of education: Not on file  . Highest education level: Not on file  Occupational History  . Not  on file  Social Needs  . Financial resource strain: Not on file  . Food insecurity:    Worry: Not on file    Inability: Not on file  . Transportation needs:    Medical: Not on file    Non-medical: Not on file  Tobacco Use  . Smoking status: Never Smoker  . Smokeless tobacco: Never Used  Substance and Sexual Activity  . Alcohol use: No  . Drug use: No  . Sexual activity: Yes    Birth control/protection: Post-menopausal  Lifestyle  . Physical activity:    Days per week: Not on file    Minutes per session: Not on file  . Stress: Not on file  Relationships  . Social connections:    Talks on phone: Not on file    Gets together: Not on file    Attends religious service: Not on file    Active member of club or organization: Not on file    Attends meetings of clubs or organizations: Not on file    Relationship status: Not on file  . Intimate partner violence:    Fear of current or ex partner: Not on file    Emotionally abused: Not on file    Physically abused: Not  on file    Forced sexual activity: Not on file  Other Topics Concern  . Not on file  Social History Narrative  . Not on file    Current Meds  Medication Sig  . Cholecalciferol (VITAMIN D3) 2000 units capsule Take by mouth.      ROS:  Review of Systems  Constitutional: Negative for fatigue, fever and unexpected weight change.  Respiratory: Negative for cough, shortness of breath and wheezing.   Cardiovascular: Negative for chest pain, palpitations and leg swelling.  Gastrointestinal: Negative for blood in stool, constipation, diarrhea, nausea and vomiting.  Endocrine: Negative for cold intolerance, heat intolerance and polyuria.  Genitourinary: Negative for dyspareunia, dysuria, flank pain, frequency, genital sores, hematuria, menstrual problem, pelvic pain, urgency, vaginal bleeding, vaginal discharge and vaginal pain.  Musculoskeletal: Negative for back pain, joint swelling and myalgias.  Skin: Negative  for rash.  Neurological: Negative for dizziness, syncope, light-headedness, numbness and headaches.  Hematological: Negative for adenopathy.  Psychiatric/Behavioral: Negative for agitation, confusion, sleep disturbance and suicidal ideas. The patient is not nervous/anxious.      Objective: BP (!) 144/80   Pulse (!) 106   Ht '5\' 7"'  (1.702 m)   Wt 131 lb (59.4 kg)   BMI 20.52 kg/m    Physical Exam Constitutional:      Appearance: She is well-developed.  Genitourinary:     Vulva, vagina, uterus, right adnexa and left adnexa normal.     No vulval lesion or tenderness noted.     No vaginal discharge, erythema or tenderness.     No cervical motion tenderness or polyp.     Uterus is not enlarged or tender.     No right or left adnexal mass present.     Right adnexa not tender.     Left adnexa not tender.  Neck:     Musculoskeletal: Normal range of motion.     Thyroid: No thyromegaly.  Cardiovascular:     Rate and Rhythm: Normal rate and regular rhythm.     Heart sounds: Normal heart sounds. No murmur.  Pulmonary:     Effort: Pulmonary effort is normal.     Breath sounds: Normal breath sounds.  Chest:     Breasts:        Right: No mass, nipple discharge, skin change or tenderness.        Left: No mass, nipple discharge, skin change or tenderness.  Abdominal:     Palpations: Abdomen is soft.     Tenderness: There is no abdominal tenderness. There is no guarding.  Musculoskeletal: Normal range of motion.  Neurological:     Mental Status: She is alert and oriented to person, place, and time.     Cranial Nerves: No cranial nerve deficit.  Psychiatric:        Behavior: Behavior normal.  Vitals signs reviewed.     Assessment/Plan:  Encounter for annual routine gynecological examination  Screening for breast cancer - Pt to sched mammo - Plan: MM 3D SCREEN BREAST BILATERAL  Increased risk of breast cancer - Cont monthly SBE, yearly CBE and mammos. Offered scr breast MRI. pt  to call for ref if desires. Cont Vit D supp  Perimenopause - F/u prn DUB         GYN counsel breast self exam, mammography screening, menopause, adequate intake of calcium and vitamin D, diet and exercise    F/U  Return in about 1 week (around 02/18/2018).  Alicia B. Copland, PA-C 02/11/2018 10:53 AM

## 2018-02-11 NOTE — Patient Instructions (Signed)
I value your feedback and entrusting us with your care. If you get a [facility] patient survey, I would appreciate you taking the time to let us know about your experience today. Thank you!  [facility]: [phone]    

## 2018-03-14 ENCOUNTER — Ambulatory Visit
Admission: RE | Admit: 2018-03-14 | Discharge: 2018-03-14 | Disposition: A | Discharge status: Home / Self Care | Payer: 59 | Source: Ambulatory Visit | Attending: Obstetrics and Gynecology | Admitting: Obstetrics and Gynecology

## 2018-03-14 DIAGNOSIS — Z1239 Encounter for other screening for malignant neoplasm of breast: Secondary | ICD-10-CM

## 2018-03-14 DIAGNOSIS — Z1231 Encounter for screening mammogram for malignant neoplasm of breast: Secondary | ICD-10-CM | POA: Diagnosis present

## 2018-03-16 ENCOUNTER — Encounter: Payer: Self-pay | Admitting: Obstetrics and Gynecology

## 2018-07-24 IMAGING — DX DG LUMBAR SPINE COMPLETE 4+V
5 series · 5 of 5 positions shown · non-contrast
Comparison: No recent prior .

CLINICAL DATA: Chronic back pain.  Sciatica.

EXAM:
LUMBAR SPINE - COMPLETE 4+ VIEW

[lumbar spine ap]
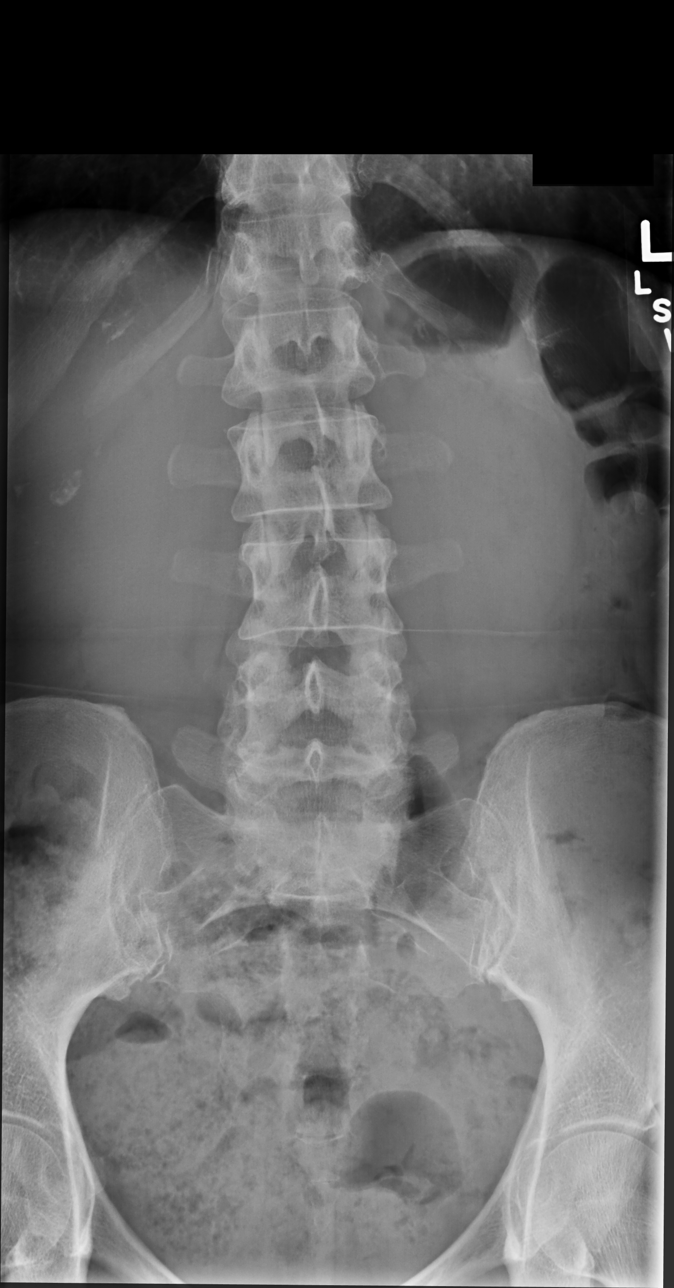

[lumbar spine obl (oblique) (1 of 2)]
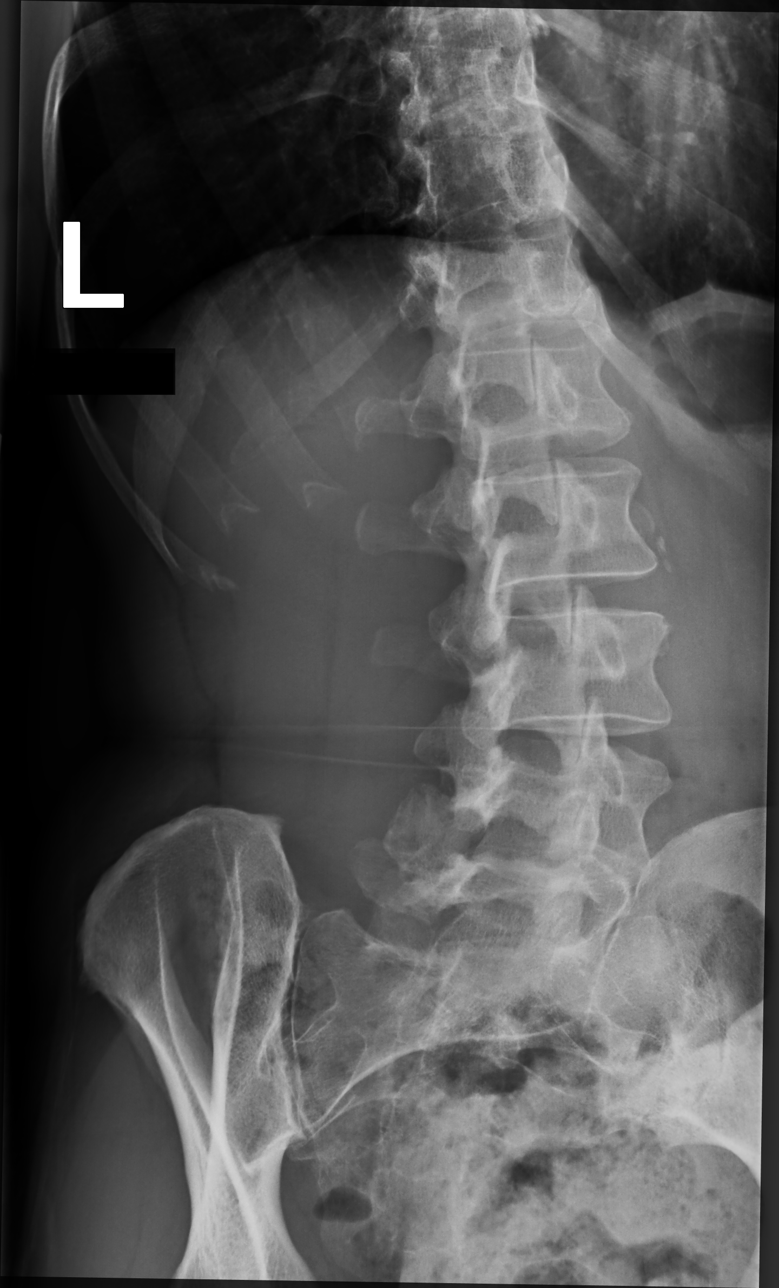

[lumbar spine obl (oblique) (2 of 2)]
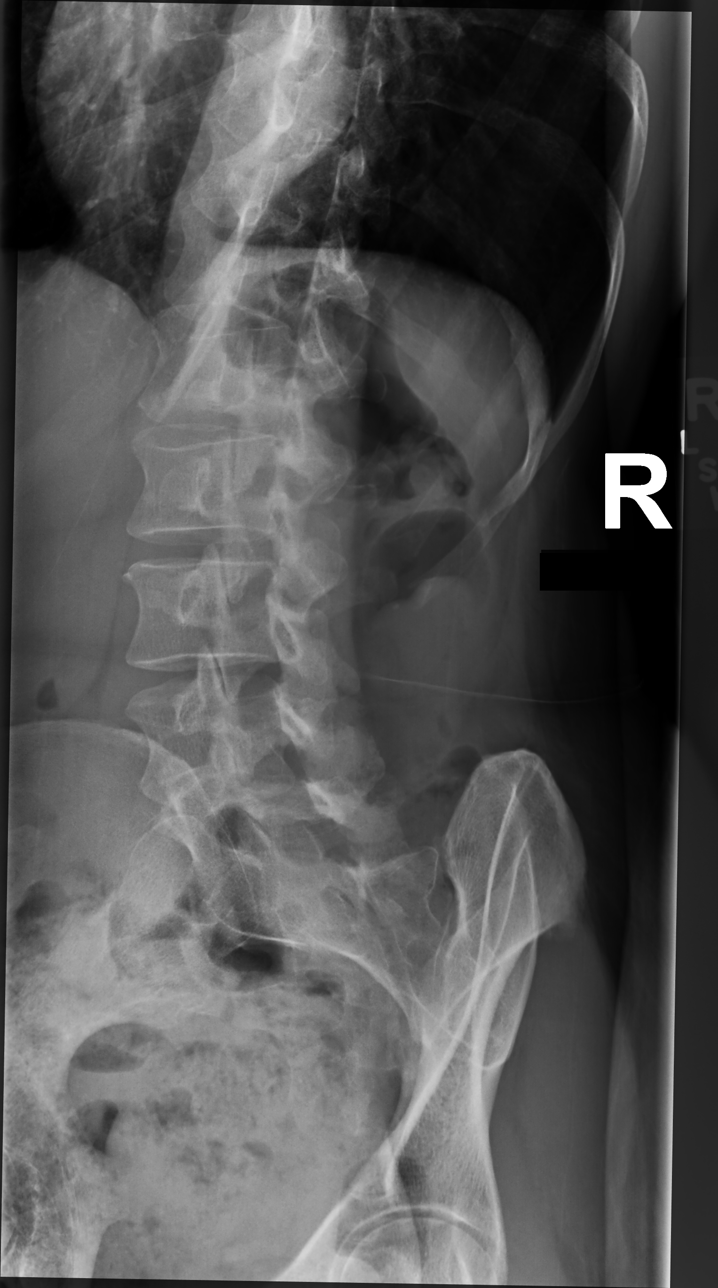

[lumbar spine lat]
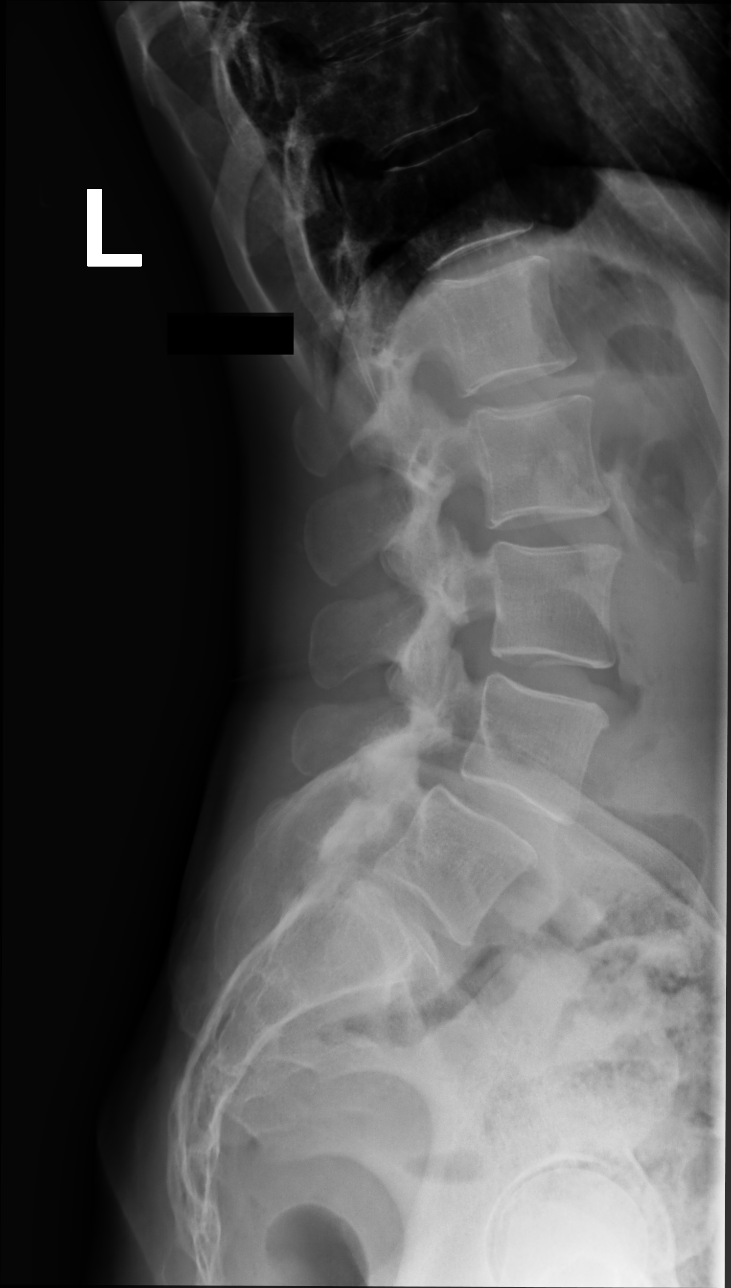

[lumbar spot lat]
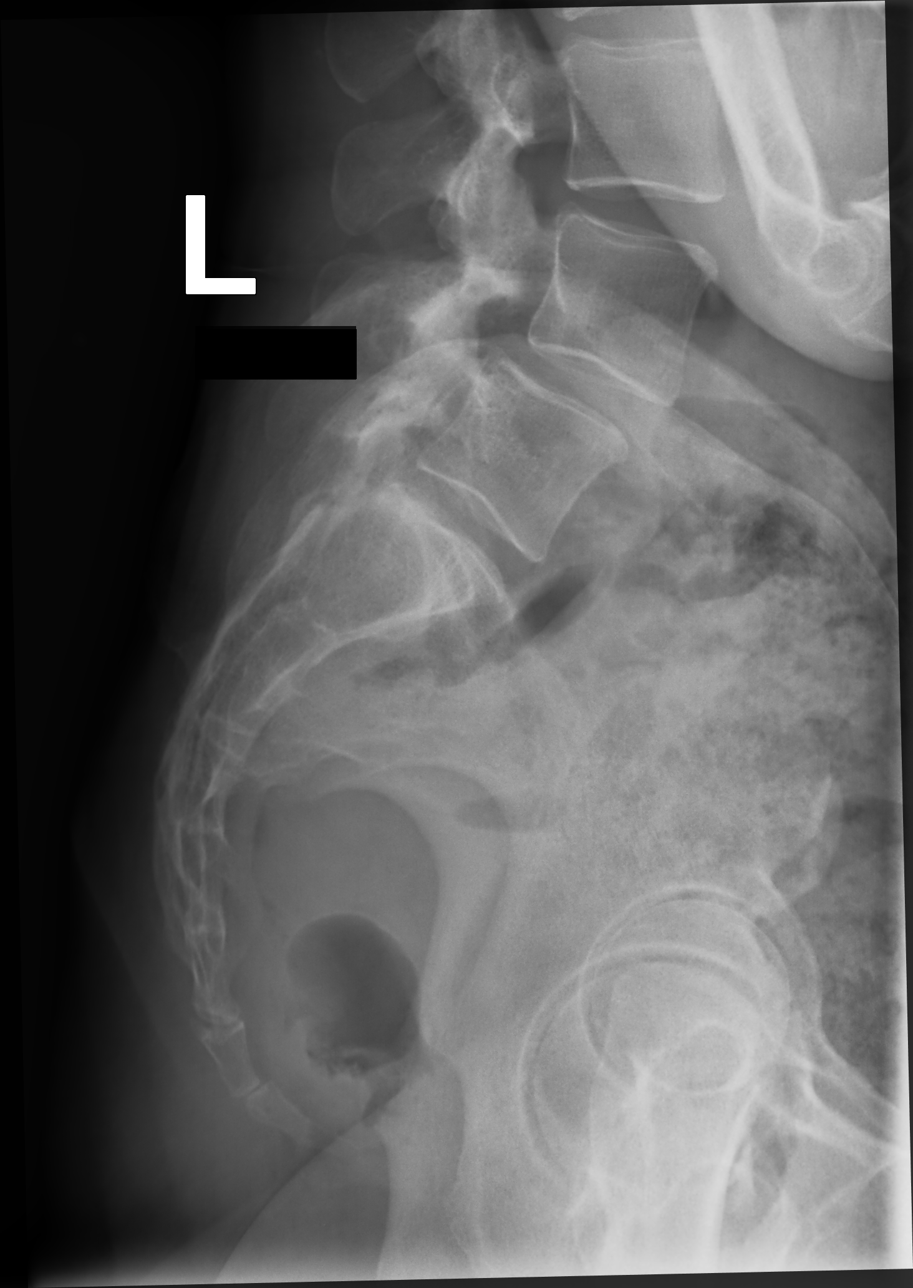

[5 of 5 positions shown; findings below may reference images not displayed]

FINDINGS: 4 mm anterolisthesis L4 on L5. No focal bony abnormality identified.
Stool noted in the colon.
IMPRESSION: 4 mm anterolisthesis L4-L5.  No focal bony abnormality identified .

## 2019-03-25 ENCOUNTER — Other Ambulatory Visit (HOSPITAL_COMMUNITY)
Admission: RE | Admit: 2019-03-25 | Discharge: 2019-03-25 | Disposition: A | Discharge status: Home / Self Care | Payer: Self-pay | Source: Ambulatory Visit | Attending: Obstetrics and Gynecology | Admitting: Obstetrics and Gynecology

## 2019-03-25 ENCOUNTER — Other Ambulatory Visit: Payer: Self-pay

## 2019-03-25 ENCOUNTER — Ambulatory Visit (INDEPENDENT_AMBULATORY_CARE_PROVIDER_SITE_OTHER): Payer: PRIVATE HEALTH INSURANCE | Admitting: Obstetrics and Gynecology

## 2019-03-25 ENCOUNTER — Encounter: Payer: Self-pay | Admitting: Obstetrics and Gynecology

## 2019-03-25 VITALS — BP 140/90 | Ht 67.0 in | Wt 121.0 lb

## 2019-03-25 DIAGNOSIS — Z9189 Other specified personal risk factors, not elsewhere classified: Secondary | ICD-10-CM

## 2019-03-25 DIAGNOSIS — Z124 Encounter for screening for malignant neoplasm of cervix: Secondary | ICD-10-CM

## 2019-03-25 DIAGNOSIS — Z1231 Encounter for screening mammogram for malignant neoplasm of breast: Secondary | ICD-10-CM

## 2019-03-25 DIAGNOSIS — Z1151 Encounter for screening for human papillomavirus (HPV): Secondary | ICD-10-CM | POA: Insufficient documentation

## 2019-03-25 DIAGNOSIS — Z01419 Encounter for gynecological examination (general) (routine) without abnormal findings: Secondary | ICD-10-CM

## 2019-03-25 DIAGNOSIS — Z803 Family history of malignant neoplasm of breast: Secondary | ICD-10-CM

## 2019-03-25 NOTE — Patient Instructions (Signed)
I value your feedback and entrusting us with your care. If you get a [facility] patient survey, I would appreciate you taking the time to let us know about your experience today. Thank you! ° °As of December 25, 2018, your lab results will be released to your MyChart immediately, before I even have a chance to see them. Please give me time to review them and contact you if there are any abnormalities. Thank you for your patience.  ° °[facility]: [phone] ° ° ° °

## 2019-03-25 NOTE — Progress Notes (Signed)
PCP: [name], MD   Chief Complaint  Patient presents with  . Gynecologic Exam    HPI:      [name] is a 59 y.o. [id number] who LMP was No LMP recorded. Patient is postmenopausal., presents today for her annual examination.  Her menses are absent due to menopause.  Last bleeding was 3/19. No PMB. She does have infrequent vasomotor sx.   Sex activity: single partner, contraception - none. She does not have vaginal dryness.  Last Pap: January 17, 2016  Results were: no abnormalities /neg HPV DNA.  Hx of STDs: none  Last mammogram: 03/14/18 Results were: normal--routine follow-up in 12 months There is a FH of breast cancer mom, MGM, and mat aunt. Pt is MyRisk neg 2016, IBIS=21.5%. There is no FH of ovarian cancer. The patient does do self-breast exams. She is taking Vit D supp. She has not had scr breast MRI.   Colonoscopy: colonoscopy 7 years ago without abnormalities. Repeat due after 10 years.   Tobacco use: The patient denies current or previous tobacco use. Alcohol use: none  No drug use Exercise: moderately active  She does get adequate calcium and Vitamin D in her diet. Labs with PCP 9/18. Normal except slightly elevated LDL (110).  Past Medical History:  Diagnosis Date  . BRCA negative 12/2014   MyRisk neg  . Chicken pox   . Family history of breast cancer   . Increased risk of breast cancer 12/2014   IBIS=21.5%    Past Surgical History:  Procedure Laterality Date  . COLONOSCOPY      Family History  Problem Relation Age of Onset  . Breast cancer Mother 25       twice  . Arthritis Mother   . Breast cancer Maternal Aunt 70  . Arthritis Father   . Diabetes Father        TYPE 2  . Hyperlipidemia Father   . Hypertension Father   . Breast cancer Maternal Grandmother 80    Social History   Socioeconomic History  . Marital status: Married    Spouse name: Not on file  . Number of children: Not on file  . Years of education: Not on file   . Highest education level: Not on file  Occupational History  . Not on file  Tobacco Use  . Smoking status: Never Smoker  . Smokeless tobacco: Never Used  Substance and Sexual Activity  . Alcohol use: No  . Drug use: No  . Sexual activity: Yes    Birth control/protection: Post-menopausal  Other Topics Concern  . Not on file  Social History Narrative  . Not on file   Social Determinants of Health   Financial Resource Strain:   . Difficulty of Paying Living Expenses: Not on file  Food Insecurity:   . Worried About Charity fundraiser in the Last Year: Not on file  . Ran Out of Food in the Last Year: Not on file  Transportation Needs:   . Lack of Transportation (Medical): Not on file  . Lack of Transportation (Non-Medical): Not on file  Physical Activity:   . Days of Exercise per Week: Not on file  . Minutes of Exercise per Session: Not on file  Stress:   . Feeling of Stress : Not on file  Social Connections:   . Frequency of Communication with Friends and Family: Not on file  . Frequency of Social Gatherings with Friends and Family: Not on file  .  Attends Religious Services: Not on file  . Active Member of Clubs or Organizations: Not on file  . Attends Archivist Meetings: Not on file  . Marital Status: Not on file  Intimate Partner Violence:   . Fear of Current or Ex-Partner: Not on file  . Emotionally Abused: Not on file  . Physically Abused: Not on file  . Sexually Abused: Not on file    Current Meds  Medication Sig  . Cholecalciferol (VITAMIN D3) 2000 units capsule Take by mouth.      ROS:  Review of Systems  Constitutional: Negative for fatigue, fever and unexpected weight change.  Respiratory: Negative for cough, shortness of breath and wheezing.   Cardiovascular: Negative for chest pain, palpitations and leg swelling.  Gastrointestinal: Negative for blood in stool, constipation, diarrhea, nausea and vomiting.  Endocrine: Negative for cold  intolerance, heat intolerance and polyuria.  Genitourinary: Negative for dyspareunia, dysuria, flank pain, frequency, genital sores, hematuria, menstrual problem, pelvic pain, urgency, vaginal bleeding, vaginal discharge and vaginal pain.  Musculoskeletal: Negative for back pain, joint swelling and myalgias.  Skin: Negative for rash.  Neurological: Negative for dizziness, syncope, light-headedness, numbness and headaches.  Hematological: Negative for adenopathy.  Psychiatric/Behavioral: Negative for agitation, confusion, sleep disturbance and suicidal ideas. The patient is not nervous/anxious.      Objective: BP 140/90   Ht '5\' 7"'  (1.702 m)   Wt 121 lb (54.9 kg)   BMI 18.95 kg/m    Physical Exam Constitutional:      Appearance: She is well-developed.  Genitourinary:     Vulva, vagina, uterus, right adnexa and left adnexa normal.     No vulval lesion or tenderness noted.     No vaginal discharge, erythema or tenderness.     No cervical motion tenderness or polyp.     Uterus is not enlarged or tender.     No right or left adnexal mass present.     Right adnexa not tender.     Left adnexa not tender.  Neck:     Thyroid: No thyromegaly.  Cardiovascular:     Rate and Rhythm: Normal rate and regular rhythm.     Heart sounds: Normal heart sounds. No murmur.  Pulmonary:     Effort: Pulmonary effort is normal.     Breath sounds: Normal breath sounds.  Chest:     Breasts:        Right: No mass, nipple discharge, skin change or tenderness.        Left: No mass, nipple discharge, skin change or tenderness.  Abdominal:     Palpations: Abdomen is soft.     Tenderness: There is no abdominal tenderness. There is no guarding.  Musculoskeletal:        General: Normal range of motion.     Cervical back: Normal range of motion.  Neurological:     General: No focal deficit present.     Mental Status: She is alert and oriented to person, place, and time.     Cranial Nerves: No cranial  nerve deficit.  Skin:    General: Skin is warm and dry.  Psychiatric:        Mood and Affect: Mood normal.        Behavior: Behavior normal.        Thought Content: Thought content normal.        Judgment: Judgment normal.  Vitals reviewed.     Assessment/Plan:  Encounter for annual routine gynecological examination  Cervical  cancer screening - Plan: Cytology - PAP  Screening for HPV (human papillomavirus) - Plan: Cytology - PAP  Encounter for screening mammogram for malignant neoplasm of breast - Plan: MM 3D SCREEN BREAST BILATERAL; pt to sched mammo.  Family history of breast cancer - Plan: MM 3D SCREEN BREAST BILATERAL  Increased risk of breast cancer - Plan: MM 3D SCREEN BREAST BILATERAL; Discussed monthly SBE, yearly CBE and mammos. Qualifies for scr breast MRI, call for ref if desires. Cont Vit D supp. F/u prn.          GYN counsel breast self exam, mammography screening, menopause, adequate intake of calcium and vitamin D, diet and exercise    F/U  Return in about 1 year (around 03/24/2020).  Farren Nelles B. Tess Potts, PA-C 03/25/2019 10:59 AM

## 2019-03-27 LAB — CYTOLOGY - PAP
Comment: NEGATIVE
Diagnosis: NEGATIVE
High risk HPV: NEGATIVE

## 2019-04-17 ENCOUNTER — Ambulatory Visit
Admission: RE | Admit: 2019-04-17 | Discharge: 2019-04-17 | Disposition: A | Discharge status: Home / Self Care | Payer: Managed Care, Other (non HMO) | Source: Ambulatory Visit | Attending: Obstetrics and Gynecology | Admitting: Obstetrics and Gynecology

## 2019-04-17 DIAGNOSIS — Z1231 Encounter for screening mammogram for malignant neoplasm of breast: Secondary | ICD-10-CM

## 2019-04-17 DIAGNOSIS — Z803 Family history of malignant neoplasm of breast: Secondary | ICD-10-CM | POA: Diagnosis present

## 2019-04-17 DIAGNOSIS — Z9189 Other specified personal risk factors, not elsewhere classified: Secondary | ICD-10-CM

## 2019-04-20 ENCOUNTER — Other Ambulatory Visit: Payer: Self-pay | Admitting: Obstetrics and Gynecology

## 2019-04-20 ENCOUNTER — Ambulatory Visit: Payer: Managed Care, Other (non HMO) | Attending: Internal Medicine

## 2019-04-20 DIAGNOSIS — R921 Mammographic calcification found on diagnostic imaging of breast: Secondary | ICD-10-CM

## 2019-04-20 DIAGNOSIS — Z23 Encounter for immunization: Secondary | ICD-10-CM

## 2019-04-20 DIAGNOSIS — R928 Other abnormal and inconclusive findings on diagnostic imaging of breast: Secondary | ICD-10-CM

## 2019-04-20 NOTE — Progress Notes (Signed)
   Covid-19 Vaccination Clinic  Name:  Dana Booth    MRN: [id number] DOB: 1960-10-11  04/20/2019  Dana Booth was observed post Covid-19 immunization for 15 minutes without incident. She was provided with Vaccine Information Sheet and instruction to access the V-Safe system.   Dana Booth was instructed to call 911 with any severe reactions post vaccine: Marland Kitchen Difficulty breathing  . Swelling of face and throat  . A fast heartbeat  . A bad rash all over body  . Dizziness and weakness   Immunizations Administered    Name Date Dose VIS Date Route   Pfizer COVID-19 Vaccine 04/20/2019  2:51 PM 0.3 mL 12/26/2018 Intramuscular   Manufacturer: [employer], [employer]   Lot: [id number]   NDC: [address]-7

## 2019-05-01 ENCOUNTER — Ambulatory Visit
Admission: RE | Admit: 2019-05-01 | Discharge: 2019-05-01 | Disposition: A | Discharge status: Home / Self Care | Payer: Managed Care, Other (non HMO) | Source: Ambulatory Visit | Attending: Obstetrics and Gynecology | Admitting: Obstetrics and Gynecology

## 2019-05-01 DIAGNOSIS — R928 Other abnormal and inconclusive findings on diagnostic imaging of breast: Secondary | ICD-10-CM | POA: Insufficient documentation

## 2019-05-01 DIAGNOSIS — R921 Mammographic calcification found on diagnostic imaging of breast: Secondary | ICD-10-CM | POA: Diagnosis present

## 2019-05-03 ENCOUNTER — Encounter: Payer: Self-pay | Admitting: Obstetrics and Gynecology

## 2019-05-13 ENCOUNTER — Ambulatory Visit: Payer: Managed Care, Other (non HMO) | Attending: Internal Medicine

## 2019-05-13 DIAGNOSIS — Z23 Encounter for immunization: Secondary | ICD-10-CM

## 2019-05-13 NOTE — Progress Notes (Signed)
   Covid-19 Vaccination Clinic  Name:  Dana Booth    MRN: [id number] DOB: [birth year]  05/13/2019  [name] was observed post Covid-19 immunization for 15 minutes without incident. She was provided with Vaccine Information Sheet and instruction to access the V-Safe system.   [name] was instructed to call 911 with any severe reactions post vaccine: Marland Kitchen Difficulty breathing  . Swelling of face and throat  . A fast heartbeat  . A bad rash all over body  . Dizziness and weakness   Immunizations Administered    Name Date Dose VIS Date Route   Pfizer COVID-19 Vaccine 05/13/2019  1:47 PM 0.3 mL 03/11/2018 Intramuscular   Manufacturer: [employer], [employer]   Lot: [id number]   NDC: [address]-6

## 2020-05-18 NOTE — Patient Instructions (Addendum)
I value your feedback and you entrusting us with your care. If you get a [facility] patient survey, I would appreciate you taking the time to let us know about your experience today. Thank you!  [facility]: [phone]      

## 2020-05-18 NOTE — Progress Notes (Signed)
PCP: [name], MD   Chief Complaint  Patient presents with  . Gynecologic Exam    No concerns    HPI:      [name] is a 60 y.o. G2P1102 who LMP was No LMP recorded. Patient is postmenopausal., presents today for her annual examination.  Her menses are absent due to menopause.  Last bleeding was 3/19. No PMB. She does have infrequent vasomotor sx.   Sex activity: single partner, contraception - none. She does not have vaginal dryness.  Last Pap: 03/25/19  Results were: no abnormalities /neg HPV DNA.  Hx of STDs: none  Last mammogram: 05/01/19 Results were: normal after addl views--routine follow-up in 12 months There is a FH of breast cancer mom, MGM, and mat aunt. Pt is MyRisk neg 2016, IBIS=21.5%. There is no FH of ovarian cancer. The patient does do self-breast exams. She is taking Vit D supp. She has not had scr breast MRI.   Colonoscopy: colonoscopy 8 years ago without abnormalities. Repeat due after 10 years.   Tobacco use: The patient denies current or previous tobacco use. Alcohol use: none  No drug use Exercise: moderately active  She does get adequate calcium and Vitamin D in her diet. Labs with PCP 9/18. Normal except slightly elevated LDL (110).  Past Medical History:  Diagnosis Date  . BRCA negative 12/2014   MyRisk neg  . Chicken pox   . Family history of breast cancer   . Increased risk of breast cancer 12/2014   IBIS=21.5%    Past Surgical History:  Procedure Laterality Date  . COLONOSCOPY      Family History  Problem Relation Age of Onset  . Breast cancer Mother 41       twice  . Arthritis Mother   . Breast cancer Maternal Aunt 70  . Arthritis Father   . Diabetes Father        TYPE 2  . Hyperlipidemia Father   . Hypertension Father   . Breast cancer Maternal Grandmother 80    Social History   Socioeconomic History  . Marital status: Married    Spouse name: Not on file  . Number of children: Not on file  . Years  of education: Not on file  . Highest education level: Not on file  Occupational History  . Not on file  Tobacco Use  . Smoking status: Never Smoker  . Smokeless tobacco: Never Used  Vaping Use  . Vaping Use: Never used  Substance and Sexual Activity  . Alcohol use: No  . Drug use: No  . Sexual activity: Yes    Birth control/protection: Post-menopausal  Other Topics Concern  . Not on file  Social History Narrative  . Not on file   Social Determinants of Health   Financial Resource Strain: Not on file  Food Insecurity: Not on file  Transportation Needs: Not on file  Physical Activity: Not on file  Stress: Not on file  Social Connections: Not on file  Intimate Partner Violence: Not on file    Current Meds  Medication Sig  . Cholecalciferol (VITAMIN D3) 2000 units capsule Take by mouth.      ROS:  Review of Systems  Constitutional: Negative for fatigue, fever and unexpected weight change.  Respiratory: Negative for cough, shortness of breath and wheezing.   Cardiovascular: Negative for chest pain, palpitations and leg swelling.  Gastrointestinal: Negative for blood in stool, constipation, diarrhea, nausea and vomiting.  Endocrine: Negative for  cold intolerance, heat intolerance and polyuria.  Genitourinary: Negative for dyspareunia, dysuria, flank pain, frequency, genital sores, hematuria, menstrual problem, pelvic pain, urgency, vaginal bleeding, vaginal discharge and vaginal pain.  Musculoskeletal: Negative for back pain, joint swelling and myalgias.  Skin: Negative for rash.  Neurological: Negative for dizziness, syncope, light-headedness, numbness and headaches.  Hematological: Negative for adenopathy.  Psychiatric/Behavioral: Negative for agitation, confusion, sleep disturbance and suicidal ideas. The patient is not nervous/anxious.      Objective: BP (!) 150/80   Ht '5\' 7"'  (1.702 m)   Wt 116 lb (52.6 kg)   BMI 18.17 kg/m    Physical Exam Constitutional:       Appearance: She is well-developed.  Genitourinary:     Vulva normal.     Right Labia: No rash, tenderness or lesions.    Left Labia: No tenderness, lesions or rash.    No vaginal discharge, erythema or tenderness.      Right Adnexa: not tender and no mass present.    Left Adnexa: not tender and no mass present.    No cervical motion tenderness, friability or polyp.     Uterus is not enlarged or tender.  Breasts:     Right: No mass, nipple discharge, skin change or tenderness.     Left: No mass, nipple discharge, skin change or tenderness.    Neck:     Thyroid: No thyromegaly.  Cardiovascular:     Rate and Rhythm: Normal rate and regular rhythm.     Heart sounds: Normal heart sounds. No murmur heard.   Pulmonary:     Effort: Pulmonary effort is normal.     Breath sounds: Normal breath sounds.  Abdominal:     Palpations: Abdomen is soft.     Tenderness: There is no abdominal tenderness. There is no guarding or rebound.  Musculoskeletal:        General: Normal range of motion.     Cervical back: Normal range of motion.  Lymphadenopathy:     Cervical: No cervical adenopathy.  Neurological:     General: No focal deficit present.     Mental Status: She is alert and oriented to person, place, and time.     Cranial Nerves: No cranial nerve deficit.  Skin:    General: Skin is warm and dry.  Psychiatric:        Mood and Affect: Mood normal.        Behavior: Behavior normal.        Thought Content: Thought content normal.        Judgment: Judgment normal.  Vitals reviewed.     Assessment/Plan:  Encounter for annual routine gynecological examination  Encounter for screening mammogram for malignant neoplasm of breast - Plan: MM 3D SCREEN BREAST BILATERAL; pt to sched mammo.  Family history of breast cancer - Plan: MM 3D SCREEN BREAST BILATERAL; pt is MyRisk neg.   Increased risk of breast cancer - Plan: MM 3D SCREEN BREAST BILATERAL; Discussed monthly SBE, yearly CBE  and mammos. Qualifies for scr breast MRI, call for ref if desires. Cont Vit D supp. F/u prn.   Will do fasting labs next yr.          GYN counsel breast self exam, mammography screening, menopause, adequate intake of calcium and vitamin D, diet and exercise    F/U  Return in about 1 year (around 05/19/2021).  Tyquavious Gamel B. Tamalyn Wadsworth, PA-C 05/19/2020 11:22 AM

## 2020-05-19 ENCOUNTER — Ambulatory Visit (INDEPENDENT_AMBULATORY_CARE_PROVIDER_SITE_OTHER): Payer: 59 | Admitting: Obstetrics and Gynecology

## 2020-05-19 ENCOUNTER — Other Ambulatory Visit: Payer: Self-pay

## 2020-05-19 ENCOUNTER — Encounter: Payer: Self-pay | Admitting: Obstetrics and Gynecology

## 2020-05-19 VITALS — BP 150/80 | Ht 67.0 in | Wt 116.0 lb

## 2020-05-19 DIAGNOSIS — Z803 Family history of malignant neoplasm of breast: Secondary | ICD-10-CM | POA: Diagnosis not present

## 2020-05-19 DIAGNOSIS — Z1231 Encounter for screening mammogram for malignant neoplasm of breast: Secondary | ICD-10-CM | POA: Diagnosis not present

## 2020-05-19 DIAGNOSIS — Z9189 Other specified personal risk factors, not elsewhere classified: Secondary | ICD-10-CM

## 2020-05-19 DIAGNOSIS — Z01419 Encounter for gynecological examination (general) (routine) without abnormal findings: Secondary | ICD-10-CM | POA: Diagnosis not present

## 2020-06-08 ENCOUNTER — Other Ambulatory Visit: Payer: Self-pay

## 2020-06-08 ENCOUNTER — Ambulatory Visit
Admission: RE | Admit: 2020-06-08 | Discharge: 2020-06-08 | Disposition: A | Discharge status: Home / Self Care | Payer: 59 | Source: Ambulatory Visit | Attending: Obstetrics and Gynecology | Admitting: Obstetrics and Gynecology

## 2020-06-08 DIAGNOSIS — Z1231 Encounter for screening mammogram for malignant neoplasm of breast: Secondary | ICD-10-CM | POA: Diagnosis present

## 2020-06-08 DIAGNOSIS — Z803 Family history of malignant neoplasm of breast: Secondary | ICD-10-CM | POA: Diagnosis present

## 2020-06-08 DIAGNOSIS — Z9189 Other specified personal risk factors, not elsewhere classified: Secondary | ICD-10-CM

## 2020-11-09 ENCOUNTER — Telehealth (INDEPENDENT_AMBULATORY_CARE_PROVIDER_SITE_OTHER): Payer: 59 | Admitting: Family Medicine

## 2020-11-09 DIAGNOSIS — J0100 Acute maxillary sinusitis, unspecified: Secondary | ICD-10-CM | POA: Diagnosis not present

## 2020-11-09 MED ORDER — AMOXICILLIN-POT CLAVULANATE 875-125 MG PO TABS: 1.0000 | ORAL_TABLET | Freq: Two times a day (BID) | ORAL | 0 refills | Status: DC

## 2020-11-09 NOTE — Progress Notes (Signed)
Patient ID: Dana Booth, female   DOB: [birth year], 60 y.o.   MRN: [id number]   This visit type was conducted due to national recommendations for restrictions regarding the COVID-19 pandemic in an effort to limit this patient's exposure and mitigate transmission in our community.   Virtual Visit via Video Note  I connected with Dana Booth on 11/09/20 at  4:00 PM EDT by a video enabled telemedicine application and verified that I am speaking with the correct person using two identifiers.  Location patient: home Location provider:work or home office Persons participating in the virtual visit: patient, provider  I discussed the limitations of evaluation and management by telemedicine and the availability of in person appointments. The patient expressed understanding and agreed to proceed.   HPI:  [name] relates several week history of sinus congestion and postnasal drip.  She went last Thursday after onset of low-grade fever to local urgent care.  She states she had testing for COVID, influenza, and strep which were all negative.  She was placed empirically on Zithromax but has not seen any real improvement.  She does have some yellowish discharge. Does have some frequent early morning headaches and increased malaise.  Generally does not have allergies this time a year.  ROS: See pertinent positives and negatives per HPI.  Past Medical History:  Diagnosis Date   BRCA negative 12/2014   MyRisk neg   Chicken pox    Family history of breast cancer    Increased risk of breast cancer 12/2014   IBIS=21.5%    Past Surgical History:  Procedure Laterality Date   COLONOSCOPY      Family History  Problem Relation Age of Onset   Breast cancer Mother 53       twice   Arthritis Mother    Breast cancer Maternal Aunt 42   Arthritis Father    Diabetes Father        TYPE 2   Hyperlipidemia Father    Hypertension Father    Breast cancer Maternal Grandmother 14    SOCIAL HX:  Non-smoker   Current Outpatient Medications:    amoxicillin-clavulanate (AUGMENTIN) 875-125 MG tablet, Take 1 tablet by mouth 2 (two) times daily., Disp: 20 tablet, Rfl: 0   Cholecalciferol (VITAMIN D3) 2000 units capsule, Take by mouth., Disp: , Rfl:   EXAM:  VITALS per patient if applicable:  GENERAL: alert, oriented, appears well and in no acute distress  HEENT: atraumatic, conjunttiva clear, no obvious abnormalities on inspection of external nose and ears  NECK: normal movements of the head and neck  LUNGS: on inspection no signs of respiratory distress, breathing rate appears normal, no obvious gross SOB, gasping or wheezing  CV: no obvious cyanosis  MS: moves all visible extremities without noticeable abnormality  PSYCH/NEURO: pleasant and cooperative, no obvious depression or anxiety, speech and thought processing grossly intact  ASSESSMENT AND PLAN:  Discussed the following assessment and plan:  Probable acute sinusitis maxillary sinuses.  Patient did have recent course of Zithromax but we explained that she may have some resistance issues going on.  Recent COVID and strep testing negative.  -Augmentin 875 mg twice daily for 10 days -If symptoms not fully resolving over the next week or 2 recommend follow-up with primary to further assess     I discussed the assessment and treatment plan with the patient. The patient was provided an opportunity to ask questions and all were answered. The patient agreed with the plan and demonstrated an understanding of  the instructions.   The patient was advised to call back or seek an in-person evaluation if the symptoms worsen or if the condition fails to improve as anticipated.     Carolann Littler, MD

## 2021-05-17 ENCOUNTER — Other Ambulatory Visit: Payer: Self-pay | Admitting: Obstetrics and Gynecology

## 2021-05-17 DIAGNOSIS — Z1231 Encounter for screening mammogram for malignant neoplasm of breast: Secondary | ICD-10-CM

## 2021-06-14 DIAGNOSIS — M3501 Sicca syndrome with keratoconjunctivitis: Secondary | ICD-10-CM | POA: Diagnosis not present

## 2021-06-15 ENCOUNTER — Ambulatory Visit
Admission: RE | Admit: 2021-06-15 | Discharge: 2021-06-15 | Disposition: A | Discharge status: Home / Self Care | Payer: BC Managed Care – PPO | Source: Ambulatory Visit | Attending: Obstetrics and Gynecology | Admitting: Obstetrics and Gynecology

## 2021-06-15 DIAGNOSIS — Z1231 Encounter for screening mammogram for malignant neoplasm of breast: Secondary | ICD-10-CM | POA: Diagnosis not present

## 2021-06-23 ENCOUNTER — Telehealth: Payer: Self-pay | Admitting: Obstetrics and Gynecology

## 2021-06-23 DIAGNOSIS — Z13220 Encounter for screening for lipoid disorders: Secondary | ICD-10-CM

## 2021-06-23 DIAGNOSIS — Z0000 Encounter for general adult medical examination without abnormal findings: Secondary | ICD-10-CM

## 2021-06-23 NOTE — Telephone Encounter (Signed)
Patient is calling to have labs order prior to her scheduled appointment on 06/29/21. Please advise? Patient is aware [facility] is out of the office until next Thursday.

## 2021-06-23 NOTE — Telephone Encounter (Signed)
Can you order labs?

## 2021-06-23 NOTE — Telephone Encounter (Signed)
Pt can not receive labs prior to appointment. [facility] is not here to order them. Did you ask what labs she wanted?

## 2021-06-23 NOTE — Telephone Encounter (Signed)
Patient just stated [facility] told her to call to scheduled labs prior to her scheduled appointment. Patient didn't specify which one [facility] wanted  her to have drawn. Per [facility] last note will do fasting labs.

## 2021-06-25 NOTE — Telephone Encounter (Signed)
Lab orders placed. Pls call pt to schedule lap appt before her annual.Thx.

## 2021-06-26 NOTE — Telephone Encounter (Signed)
Called and left voicemail for patient to call back to be scheduled. 

## 2021-06-27 NOTE — Telephone Encounter (Signed)
Patient is scheduled for labs on 06/28/21

## 2021-06-28 ENCOUNTER — Other Ambulatory Visit: Payer: Self-pay

## 2021-06-28 ENCOUNTER — Other Ambulatory Visit: Payer: BC Managed Care – PPO

## 2021-06-28 DIAGNOSIS — Z0000 Encounter for general adult medical examination without abnormal findings: Secondary | ICD-10-CM | POA: Diagnosis not present

## 2021-06-28 DIAGNOSIS — Z13220 Encounter for screening for lipoid disorders: Secondary | ICD-10-CM | POA: Diagnosis not present

## 2021-06-28 NOTE — Progress Notes (Signed)
PCP: [name], MD   Chief Complaint  Patient presents with   Gynecologic Exam    No concerns    HPI:      [name] is a 61 y.o. G2P1102 who LMP was No LMP recorded. Patient is postmenopausal., presents today for her annual examination.  Her menses are absent due to menopause.  Last bleeding was 3/19. No PMB. She does have infrequent vasomotor sx.   Sex activity: single partner, contraception - none. She does not have vaginal dryness.  Last Pap: 03/25/19  Results were: no abnormalities /neg HPV DNA.  Hx of STDs: none  Last mammogram: 06/15/21 Results were: normal --routine follow-up in 12 months There is a FH of breast cancer mom, MGM, now sister, and mat aunt. Sister and mat aunt are gene neg (not sure if panel or BRCA only). Pt is MyRisk neg 2016, IBIS=21.5%. There is no FH of ovarian cancer. The patient does do self-breast exams. She is taking Vit D supp. She has not had scr breast MRI.   Colonoscopy: colonoscopy 3/15 with Dr. Allen Norris without abnormalities. Repeat due after 10 years.   Tobacco use: The patient denies current or previous tobacco use. Alcohol use: none  No drug use Exercise: moderately active  She does get adequate calcium and Vitamin D in her diet. Labs 6/23 WNL.   Past Medical History:  Diagnosis Date   BRCA negative 12/2014   MyRisk neg   Chicken pox    Family history of breast cancer    Increased risk of breast cancer 12/2014   IBIS=21.5%    Past Surgical History:  Procedure Laterality Date   COLONOSCOPY  03/2013   with Dr. Allen Norris; done at [facility]    Family History  Problem Relation Age of Onset   Breast cancer Mother 14       twice   Arthritis Mother    Arthritis Father    Diabetes Father        TYPE 2   Hyperlipidemia Father    Hypertension Father    Breast cancer Sister 8       DCIS   Breast cancer Maternal Grandmother 80   Breast cancer Maternal Aunt 70    Social History   Socioeconomic History    Marital status: Married    Spouse name: Not on file   Number of children: Not on file   Years of education: Not on file   Highest education level: Not on file  Occupational History   Not on file  Tobacco Use   Smoking status: Never   Smokeless tobacco: Never  Vaping Use   Vaping Use: Never used  Substance and Sexual Activity   Alcohol use: No   Drug use: No   Sexual activity: Yes    Birth control/protection: Post-menopausal  Other Topics Concern   Not on file  Social History Narrative   Not on file   Social Determinants of Health   Financial Resource Strain: Not on file  Food Insecurity: Not on file  Transportation Needs: Not on file  Physical Activity: Not on file  Stress: Not on file  Social Connections: Not on file  Intimate Partner Violence: Not on file    Current Meds  Medication Sig   Cholecalciferol (VITAMIN D3) 2000 units capsule Take by mouth.   Flaxseed, Linseed, (FLAX SEED OIL PO) Take 1,200 mg by mouth daily.      ROS:  Review of Systems  Constitutional:  Negative  for fatigue, fever and unexpected weight change.  Respiratory:  Negative for cough, shortness of breath and wheezing.   Cardiovascular:  Negative for chest pain, palpitations and leg swelling.  Gastrointestinal:  Negative for blood in stool, constipation, diarrhea, nausea and vomiting.  Endocrine: Negative for cold intolerance, heat intolerance and polyuria.  Genitourinary:  Negative for dyspareunia, dysuria, flank pain, frequency, genital sores, hematuria, menstrual problem, pelvic pain, urgency, vaginal bleeding, vaginal discharge and vaginal pain.  Musculoskeletal:  Negative for back pain, joint swelling and myalgias.  Skin:  Negative for rash.  Neurological:  Negative for dizziness, syncope, light-headedness, numbness and headaches.  Hematological:  Negative for adenopathy.  Psychiatric/Behavioral:  Negative for agitation, confusion, sleep disturbance and suicidal ideas. The patient is  not nervous/anxious.      Objective: BP 128/74   Ht '5\' 7"'  (1.702 m)   Wt 118 lb (53.5 kg)   BMI 18.48 kg/m    Physical Exam Constitutional:      Appearance: She is well-developed.  Genitourinary:     Vulva normal.     Right Labia: No rash, tenderness or lesions.    Left Labia: No tenderness, lesions or rash.    No vaginal discharge, erythema or tenderness.      Right Adnexa: not tender and no mass present.    Left Adnexa: not tender and no mass present.    No cervical motion tenderness, friability or polyp.     Uterus is not enlarged or tender.  Breasts:    Right: No mass, nipple discharge, skin change or tenderness.     Left: No mass, nipple discharge, skin change or tenderness.  Neck:     Thyroid: No thyromegaly.  Cardiovascular:     Rate and Rhythm: Normal rate and regular rhythm.     Heart sounds: Normal heart sounds. No murmur heard. Pulmonary:     Effort: Pulmonary effort is normal.     Breath sounds: Normal breath sounds.  Abdominal:     Palpations: Abdomen is soft.     Tenderness: There is no abdominal tenderness. There is no guarding or rebound.  Musculoskeletal:        General: Normal range of motion.     Cervical back: Normal range of motion.  Lymphadenopathy:     Cervical: No cervical adenopathy.  Neurological:     General: No focal deficit present.     Mental Status: She is alert and oriented to person, place, and time.     Cranial Nerves: No cranial nerve deficit.  Skin:    General: Skin is warm and dry.  Psychiatric:        Mood and Affect: Mood normal.        Behavior: Behavior normal.        Thought Content: Thought content normal.        Judgment: Judgment normal.  Vitals reviewed.     Assessment/Plan: Encounter for annual routine gynecological examination  Encounter for screening mammogram for malignant neoplasm of breast; pt current on mammo  Family history of breast cancer - Plan: MM 3D SCREEN BREAST BILATERAL; pt is MyRisk neg.    Increased risk of breast cancer - Plan: MM 3D SCREEN BREAST BILATERAL; Discussed monthly SBE, yearly CBE and mammos. Qualifies for scr breast MRI, call for ref if desires. Cont Vit D supp. F/u prn.          GYN counsel breast self exam, mammography screening, menopause, adequate intake of calcium and vitamin D, diet and exercise  F/U  Return in about 1 year (around 06/30/2022).  Ester Mabe B. Abdelrahman Nair, PA-C 06/29/2021 2:45 PM

## 2021-06-29 ENCOUNTER — Ambulatory Visit (INDEPENDENT_AMBULATORY_CARE_PROVIDER_SITE_OTHER): Payer: BC Managed Care – PPO | Admitting: Obstetrics and Gynecology

## 2021-06-29 ENCOUNTER — Encounter: Payer: Self-pay | Admitting: Obstetrics and Gynecology

## 2021-06-29 VITALS — BP 128/74 | Ht 67.0 in | Wt 118.0 lb

## 2021-06-29 DIAGNOSIS — Z9189 Other specified personal risk factors, not elsewhere classified: Secondary | ICD-10-CM

## 2021-06-29 DIAGNOSIS — Z1231 Encounter for screening mammogram for malignant neoplasm of breast: Secondary | ICD-10-CM | POA: Diagnosis not present

## 2021-06-29 DIAGNOSIS — Z01419 Encounter for gynecological examination (general) (routine) without abnormal findings: Secondary | ICD-10-CM | POA: Diagnosis not present

## 2021-06-29 DIAGNOSIS — Z803 Family history of malignant neoplasm of breast: Secondary | ICD-10-CM

## 2021-06-29 DIAGNOSIS — Z1211 Encounter for screening for malignant neoplasm of colon: Secondary | ICD-10-CM

## 2021-06-29 LAB — COMPREHENSIVE METABOLIC PANEL
ALT: 12 IU/L (ref 0–32)
AST: 19 IU/L (ref 0–40)
Albumin/Globulin Ratio: 1.7 (ref 1.2–2.2)
Albumin: 5 g/dL — ABNORMAL HIGH (ref 3.8–4.8)
Alkaline Phosphatase: 62 IU/L (ref 44–121)
BUN/Creatinine Ratio: 23 (ref 12–28)
BUN: 17 mg/dL (ref 8–27)
Bilirubin Total: 1 mg/dL (ref 0.0–1.2)
CO2: 25 mmol/L (ref 20–29)
Calcium: 9.7 mg/dL (ref 8.7–10.3)
Chloride: 100 mmol/L (ref 96–106)
Creatinine, Ser: 0.74 mg/dL (ref 0.57–1.00)
Globulin, Total: 2.9 g/dL (ref 1.5–4.5)
Glucose: 101 mg/dL — ABNORMAL HIGH (ref 70–99)
Potassium: 4.3 mmol/L (ref 3.5–5.2)
Sodium: 142 mmol/L (ref 134–144)
Total Protein: 7.9 g/dL (ref 6.0–8.5)
eGFR: 91 mL/min/{1.73_m2} (ref >59)

## 2021-06-29 LAB — LIPID PANEL
Chol/HDL Ratio: 2.4 ratio (ref 0.0–4.4)
Cholesterol, Total: 173 mg/dL (ref 100–199)
HDL: 72 mg/dL (ref >39)
LDL Chol Calc (NIH): 91 mg/dL (ref 0–99)
Triglycerides: 49 mg/dL (ref 0–149)
VLDL Cholesterol Cal: 10 mg/dL (ref 5–40)

## 2021-06-29 NOTE — Patient Instructions (Signed)
I value your feedback and you entrusting us with your care. If you get a [place] patient survey, I would appreciate you taking the time to let us know about your experience today. Thank you! ? ? ?

## 2021-07-14 DIAGNOSIS — M3501 Sicca syndrome with keratoconjunctivitis: Secondary | ICD-10-CM | POA: Diagnosis not present

## 2021-08-17 ENCOUNTER — Encounter: Payer: Self-pay | Admitting: Obstetrics and Gynecology

## 2022-05-23 ENCOUNTER — Other Ambulatory Visit: Payer: Self-pay | Admitting: Obstetrics and Gynecology

## 2022-05-23 DIAGNOSIS — R928 Other abnormal and inconclusive findings on diagnostic imaging of breast: Secondary | ICD-10-CM

## 2022-06-19 ENCOUNTER — Ambulatory Visit
Admission: RE | Admit: 2022-06-19 | Discharge: 2022-06-19 | Disposition: A | Discharge status: Home / Self Care | Payer: BC Managed Care – PPO | Source: Ambulatory Visit | Attending: Obstetrics and Gynecology | Admitting: Obstetrics and Gynecology

## 2022-06-19 DIAGNOSIS — Z1231 Encounter for screening mammogram for malignant neoplasm of breast: Secondary | ICD-10-CM | POA: Insufficient documentation

## 2022-06-19 DIAGNOSIS — R928 Other abnormal and inconclusive findings on diagnostic imaging of breast: Secondary | ICD-10-CM | POA: Diagnosis not present

## 2022-07-23 NOTE — Progress Notes (Unsigned)
PCP: Glori Luis, MD   No chief complaint on file.   HPI:      [name] is a 62 y.o. G2P1102 who LMP was No LMP recorded. Patient is postmenopausal., presents today for her annual examination.  Her menses are absent due to menopause.  Last bleeding was 3/19. No PMB. She does have infrequent vasomotor sx.   Sex activity: single partner, contraception - none. She does not have vaginal dryness.  Last Pap: 03/25/19  Results were: no abnormalities /neg HPV DNA.  Hx of STDs: none  Last mammogram: 06/19/22 Results were: normal --routine follow-up in 12 months There is a FH of breast cancer mom, MGM, now sister, and mat aunt. Sister and mat aunt are gene neg (not sure if panel or BRCA only). Pt is MyRisk neg 2016, IBIS=21.5%. There is no FH of ovarian cancer. The patient does do self-breast exams. She is taking Vit D supp. She has not had scr breast MRI.   Colonoscopy: colonoscopy 3/15 with Dr. Servando Snare without abnormalities. Repeat due after 10 years.   Tobacco use: The patient denies current or previous tobacco use. Alcohol use: none  No drug use Exercise: moderately active  She does get adequate calcium and Vitamin D in her diet. Labs 6/23 WNL.   Past Medical History:  Diagnosis Date   BRCA negative 12/2014   MyRisk neg   Chicken pox    Elevated LDL cholesterol level    Family history of breast cancer    Increased risk of breast cancer 12/2014   IBIS=21.5%    Past Surgical History:  Procedure Laterality Date   COLONOSCOPY  03/2013   with Dr. Servando Snare; done at [facility]    Family History  Problem Relation Age of Onset   Breast cancer Mother 40       twice   Arthritis Mother    Arthritis Father    Diabetes Father        TYPE 2   Hyperlipidemia Father    Hypertension Father    Breast cancer Sister 64       DCIS   Breast cancer Maternal Grandmother 52   Breast cancer Maternal Aunt 65    Social History   Socioeconomic History   Marital status:  Married    Spouse name: Not on file   Number of children: Not on file   Years of education: Not on file   Highest education level: Not on file  Occupational History   Not on file  Tobacco Use   Smoking status: Never   Smokeless tobacco: Never  Vaping Use   Vaping Use: Never used  Substance and Sexual Activity   Alcohol use: No   Drug use: No   Sexual activity: Yes    Birth control/protection: Post-menopausal  Other Topics Concern   Not on file  Social History Narrative   Not on file   Social Determinants of Health   Financial Resource Strain: Not on file  Food Insecurity: Not on file  Transportation Needs: Not on file  Physical Activity: Not on file  Stress: Not on file  Social Connections: Not on file  Intimate Partner Violence: Not on file    No outpatient medications have been marked as taking for the 07/24/22 encounter (Appointment) with Jakeline Dave, Helmut Muster B, PA-C.      ROS:  Review of Systems  Constitutional:  Negative for fatigue, fever and unexpected weight change.  Respiratory:  Negative for cough, shortness of breath  and wheezing.   Cardiovascular:  Negative for chest pain, palpitations and leg swelling.  Gastrointestinal:  Negative for blood in stool, constipation, diarrhea, nausea and vomiting.  Endocrine: Negative for cold intolerance, heat intolerance and polyuria.  Genitourinary:  Negative for dyspareunia, dysuria, flank pain, frequency, genital sores, hematuria, menstrual problem, pelvic pain, urgency, vaginal bleeding, vaginal discharge and vaginal pain.  Musculoskeletal:  Negative for back pain, joint swelling and myalgias.  Skin:  Negative for rash.  Neurological:  Negative for dizziness, syncope, light-headedness, numbness and headaches.  Hematological:  Negative for adenopathy.  Psychiatric/Behavioral:  Negative for agitation, confusion, sleep disturbance and suicidal ideas. The patient is not nervous/anxious.      Objective: There were no vitals  taken for this visit.   Physical Exam Constitutional:      Appearance: She is well-developed.  Genitourinary:     Vulva normal.     Right Labia: No rash, tenderness or lesions.    Left Labia: No tenderness, lesions or rash.    No vaginal discharge, erythema or tenderness.      Right Adnexa: not tender and no mass present.    Left Adnexa: not tender and no mass present.    No cervical motion tenderness, friability or polyp.     Uterus is not enlarged or tender.  Breasts:    Right: No mass, nipple discharge, skin change or tenderness.     Left: No mass, nipple discharge, skin change or tenderness.  Neck:     Thyroid: No thyromegaly.  Cardiovascular:     Rate and Rhythm: Normal rate and regular rhythm.     Heart sounds: Normal heart sounds. No murmur heard. Pulmonary:     Effort: Pulmonary effort is normal.     Breath sounds: Normal breath sounds.  Abdominal:     Palpations: Abdomen is soft.     Tenderness: There is no abdominal tenderness. There is no guarding or rebound.  Musculoskeletal:        General: Normal range of motion.     Cervical back: Normal range of motion.  Lymphadenopathy:     Cervical: No cervical adenopathy.  Neurological:     General: No focal deficit present.     Mental Status: She is alert and oriented to person, place, and time.     Cranial Nerves: No cranial nerve deficit.  Skin:    General: Skin is warm and dry.  Psychiatric:        Mood and Affect: Mood normal.        Behavior: Behavior normal.        Thought Content: Thought content normal.        Judgment: Judgment normal.  Vitals reviewed.     Assessment/Plan: Encounter for annual routine gynecological examination  Encounter for screening mammogram for malignant neoplasm of breast; pt current on mammo  Family history of breast cancer - Plan: MM 3D SCREEN BREAST BILATERAL; pt is MyRisk neg.   Increased risk of breast cancer - Plan: MM 3D SCREEN BREAST BILATERAL; Discussed monthly SBE,  yearly CBE and mammos. Qualifies for scr breast MRI, call for ref if desires. Cont Vit D supp. F/u prn.          GYN counsel breast self exam, mammography screening, menopause, adequate intake of calcium and vitamin D, diet and exercise    F/U  No follow-ups on file.  Orbie Grupe B. Dois Juarbe, PA-C 07/23/2022 4:55 PM

## 2022-07-24 ENCOUNTER — Encounter: Payer: Self-pay | Admitting: Obstetrics and Gynecology

## 2022-07-24 ENCOUNTER — Ambulatory Visit (INDEPENDENT_AMBULATORY_CARE_PROVIDER_SITE_OTHER): Payer: BC Managed Care – PPO | Admitting: Obstetrics and Gynecology

## 2022-07-24 VITALS — BP 128/74 | Ht 67.0 in | Wt 122.0 lb

## 2022-07-24 DIAGNOSIS — Z1231 Encounter for screening mammogram for malignant neoplasm of breast: Secondary | ICD-10-CM

## 2022-07-24 DIAGNOSIS — Z9189 Other specified personal risk factors, not elsewhere classified: Secondary | ICD-10-CM

## 2022-07-24 DIAGNOSIS — Z01419 Encounter for gynecological examination (general) (routine) without abnormal findings: Secondary | ICD-10-CM | POA: Diagnosis not present

## 2022-07-24 DIAGNOSIS — Z803 Family history of malignant neoplasm of breast: Secondary | ICD-10-CM

## 2022-07-24 NOTE — Patient Instructions (Signed)
I value your feedback and you entrusting us with your care. If you get a [place] patient survey, I would appreciate you taking the time to let us know about your experience today. Thank you! ? ? ?

## 2023-05-30 ENCOUNTER — Other Ambulatory Visit: Payer: Self-pay | Admitting: Obstetrics and Gynecology

## 2023-05-30 DIAGNOSIS — Z1231 Encounter for screening mammogram for malignant neoplasm of breast: Secondary | ICD-10-CM

## 2023-06-20 ENCOUNTER — Ambulatory Visit
Admission: RE | Admit: 2023-06-20 | Discharge: 2023-06-20 | Disposition: A | Discharge status: Home / Self Care | Source: Ambulatory Visit | Attending: Obstetrics and Gynecology | Admitting: Obstetrics and Gynecology

## 2023-06-20 DIAGNOSIS — Z1231 Encounter for screening mammogram for malignant neoplasm of breast: Secondary | ICD-10-CM | POA: Diagnosis not present

## 2023-06-25 ENCOUNTER — Other Ambulatory Visit: Payer: Self-pay | Admitting: Obstetrics and Gynecology

## 2023-06-25 ENCOUNTER — Ambulatory Visit: Payer: Self-pay | Admitting: Obstetrics and Gynecology

## 2023-06-25 DIAGNOSIS — R928 Other abnormal and inconclusive findings on diagnostic imaging of breast: Secondary | ICD-10-CM

## 2023-07-02 ENCOUNTER — Ambulatory Visit
Admission: RE | Admit: 2023-07-02 | Discharge: 2023-07-02 | Disposition: A | Discharge status: Home / Self Care | Source: Ambulatory Visit | Attending: Obstetrics and Gynecology | Admitting: Obstetrics and Gynecology

## 2023-07-02 ENCOUNTER — Ambulatory Visit: Payer: Self-pay | Admitting: Obstetrics and Gynecology

## 2023-07-02 DIAGNOSIS — R928 Other abnormal and inconclusive findings on diagnostic imaging of breast: Secondary | ICD-10-CM

## 2023-07-02 DIAGNOSIS — R92333 Mammographic heterogeneous density, bilateral breasts: Secondary | ICD-10-CM | POA: Diagnosis not present

## 2023-07-29 NOTE — Progress Notes (Unsigned)
 PCP: Maribeth Camellia MATSU, MD (Inactive)   No chief complaint on file.   HPI:      [name] is a 63 y.o. [id number] who LMP was No LMP recorded. Patient is postmenopausal., presents today for her annual examination.  Her menses are absent due to menopause.  Last bleeding was 3/19. No PMB. She does have infrequent vasomotor sx.   Sex activity: single partner, contraception - none. She does not have vaginal dryness/pain/bleeding.  Last Pap: 03/25/19  Results were: no abnormalities /neg HPV DNA.  Hx of STDs: none  Last mammogram: 07/02/23 Results were: normal after addl imaging--routine follow-up in 12 months There is a FH of breast cancer mom, MGM, now sister, and mat aunt. Sister and mat aunt are gene neg (not sure if panel or BRCA only). Pt is MyRisk neg 2016, IBIS=21.5%. There is no FH of ovarian cancer. The patient does do self-breast exams. She is taking Vit D supp. She has not had scr breast MRI.   Colonoscopy: colonoscopy 3/15 with Dr. Jinny without abnormalities. Repeat due after 10 years.   Tobacco use: The patient denies current or previous tobacco use. Alcohol use: none  No drug use Exercise: moderately active  She does get adequate calcium and Vitamin D in her diet. Labs 6/23 WNL.   Past Medical History:  Diagnosis Date   BRCA negative 12/2014   MyRisk neg   Chicken pox    Elevated LDL cholesterol level    Family history of breast cancer    Increased risk of breast cancer 12/2014   IBIS=21.5%    Past Surgical History:  Procedure Laterality Date   COLONOSCOPY  03/2013   with Dr. Jinny; done at [facility]    Family History  Problem Relation Age of Onset   Breast cancer Mother 32       twice   Arthritis Mother    Arthritis Father    Diabetes Father        TYPE 2   Hyperlipidemia Father    Hypertension Father    Breast cancer Sister 13       DCIS   Breast cancer Maternal Grandmother 31   Breast cancer Maternal Aunt 31    Social History    Socioeconomic History   Marital status: Married    Spouse name: Not on file   Number of children: Not on file   Years of education: Not on file   Highest education level: Not on file  Occupational History   Not on file  Tobacco Use   Smoking status: Never   Smokeless tobacco: Never  Vaping Use   Vaping status: Never Used  Substance and Sexual Activity   Alcohol use: No   Drug use: No   Sexual activity: Yes    Birth control/protection: Post-menopausal  Other Topics Concern   Not on file  Social History Narrative   Not on file   Social Drivers of Health   Financial Resource Strain: Not on file  Food Insecurity: Not on file  Transportation Needs: Not on file  Physical Activity: Not on file  Stress: Not on file  Social Connections: Not on file  Intimate Partner Violence: Not on file    No outpatient medications have been marked as taking for the 07/30/23 encounter (Appointment) with Sibel Khurana, Bernarda NOVAK, PA-C.      ROS:  Review of Systems  Constitutional:  Negative for fatigue, fever and unexpected weight change.  Respiratory:  Negative for cough,  shortness of breath and wheezing.   Cardiovascular:  Negative for chest pain, palpitations and leg swelling.  Gastrointestinal:  Negative for blood in stool, constipation, diarrhea, nausea and vomiting.  Endocrine: Negative for cold intolerance, heat intolerance and polyuria.  Genitourinary:  Negative for dyspareunia, dysuria, flank pain, frequency, genital sores, hematuria, menstrual problem, pelvic pain, urgency, vaginal bleeding, vaginal discharge and vaginal pain.  Musculoskeletal:  Negative for back pain, joint swelling and myalgias.  Skin:  Negative for rash.  Neurological:  Negative for dizziness, syncope, light-headedness, numbness and headaches.  Hematological:  Negative for adenopathy.  Psychiatric/Behavioral:  Negative for agitation, confusion, sleep disturbance and suicidal ideas. The patient is not  nervous/anxious.      Objective: There were no vitals taken for this visit.   Physical Exam Constitutional:      Appearance: She is well-developed.  Genitourinary:     Vulva normal.     Right Labia: No rash, tenderness or lesions.    Left Labia: No tenderness, lesions or rash.    No vaginal discharge, erythema or tenderness.      Right Adnexa: not tender and no mass present.    Left Adnexa: not tender and no mass present.    No cervical motion tenderness, friability or polyp.     Uterus is not enlarged or tender.  Breasts:    Right: No mass, nipple discharge, skin change or tenderness.     Left: No mass, nipple discharge, skin change or tenderness.  Neck:     Thyroid: No thyromegaly.  Cardiovascular:     Rate and Rhythm: Normal rate and regular rhythm.     Heart sounds: Normal heart sounds. No murmur heard. Pulmonary:     Effort: Pulmonary effort is normal.     Breath sounds: Normal breath sounds.  Abdominal:     Palpations: Abdomen is soft.     Tenderness: There is no abdominal tenderness. There is no guarding or rebound.  Musculoskeletal:        General: Normal range of motion.     Cervical back: Normal range of motion.  Lymphadenopathy:     Cervical: No cervical adenopathy.  Neurological:     General: No focal deficit present.     Mental Status: She is alert and oriented to person, place, and time.     Cranial Nerves: No cranial nerve deficit.  Skin:    General: Skin is warm and dry.  Psychiatric:        Mood and Affect: Mood normal.        Behavior: Behavior normal.        Thought Content: Thought content normal.        Judgment: Judgment normal.  Vitals reviewed.     Assessment/Plan: Encounter for annual routine gynecological examination  Encounter for screening mammogram for malignant neoplasm of breast; pt current on mammo  Family history of breast cancer - Plan: MM 3D SCREEN BREAST BILATERAL; pt is MyRisk neg.   Increased risk of breast cancer -  Plan: MM 3D SCREEN BREAST BILATERAL; Discussed monthly SBE, yearly CBE and mammos. Qualifies for scr breast MRI, call for ref if desires. Cont Vit D supp. F/u prn.          GYN counsel breast self exam, mammography screening, menopause, adequate intake of calcium and vitamin D, diet and exercise    F/U  No follow-ups on file.  Ashwini Jago B. Calvin Jablonowski, PA-C 07/29/2023 4:55 PM

## 2023-07-30 ENCOUNTER — Other Ambulatory Visit (HOSPITAL_COMMUNITY)
Admission: RE | Admit: 2023-07-30 | Discharge: 2023-07-30 | Disposition: A | Discharge status: Home / Self Care | Source: Ambulatory Visit | Attending: Obstetrics and Gynecology | Admitting: Obstetrics and Gynecology

## 2023-07-30 ENCOUNTER — Ambulatory Visit (INDEPENDENT_AMBULATORY_CARE_PROVIDER_SITE_OTHER): Admitting: Obstetrics and Gynecology

## 2023-07-30 ENCOUNTER — Encounter: Payer: Self-pay | Admitting: Obstetrics and Gynecology

## 2023-07-30 VITALS — BP 146/63 | HR 99 | Ht 67.0 in | Wt 119.0 lb

## 2023-07-30 DIAGNOSIS — Z1151 Encounter for screening for human papillomavirus (HPV): Secondary | ICD-10-CM | POA: Diagnosis not present

## 2023-07-30 DIAGNOSIS — Z01419 Encounter for gynecological examination (general) (routine) without abnormal findings: Secondary | ICD-10-CM | POA: Diagnosis not present

## 2023-07-30 DIAGNOSIS — Z803 Family history of malignant neoplasm of breast: Secondary | ICD-10-CM | POA: Diagnosis not present

## 2023-07-30 DIAGNOSIS — Z9189 Other specified personal risk factors, not elsewhere classified: Secondary | ICD-10-CM | POA: Diagnosis not present

## 2023-07-30 DIAGNOSIS — Z1211 Encounter for screening for malignant neoplasm of colon: Secondary | ICD-10-CM

## 2023-07-30 DIAGNOSIS — Z122 Encounter for screening for malignant neoplasm of respiratory organs: Secondary | ICD-10-CM | POA: Diagnosis not present

## 2023-07-30 DIAGNOSIS — Z124 Encounter for screening for malignant neoplasm of cervix: Secondary | ICD-10-CM

## 2023-07-30 DIAGNOSIS — Z0000 Encounter for general adult medical examination without abnormal findings: Secondary | ICD-10-CM

## 2023-07-30 DIAGNOSIS — Z1231 Encounter for screening mammogram for malignant neoplasm of breast: Secondary | ICD-10-CM

## 2023-07-30 NOTE — Patient Instructions (Signed)
 I value your feedback and you entrusting [place] with your care. If you get a [place] patient survey, I would appreciate you taking the time to let us know about your experience today. Thank you! ? ? ?

## 2023-08-01 LAB — CYTOLOGY - PAP
Comment: NEGATIVE
Diagnosis: NEGATIVE
High risk HPV: NEGATIVE

## 2023-08-12 DIAGNOSIS — Z1211 Encounter for screening for malignant neoplasm of colon: Secondary | ICD-10-CM | POA: Diagnosis not present

## 2023-08-16 LAB — COLOGUARD: COLOGUARD: NEGATIVE

## 2023-08-18 ENCOUNTER — Ambulatory Visit: Payer: Self-pay | Admitting: Obstetrics and Gynecology
